# Patient Record
Sex: Female | Born: 1975
Health system: Southern US, Community
[De-identification: ages and names within clinical notes are randomized; demographics above are authoritative.]

## PROBLEM LIST (undated history)

## (undated) DIAGNOSIS — T7840XA Allergy, unspecified, initial encounter: Secondary | ICD-10-CM

## (undated) DIAGNOSIS — E119 Type 2 diabetes mellitus without complications: Secondary | ICD-10-CM

## (undated) HISTORY — DX: Allergy, unspecified, initial encounter: T78.40XA

## (undated) HISTORY — PX: WISDOM TOOTH EXTRACTION: SHX21

---

## 1998-02-20 HISTORY — PX: UMBILICAL HERNIA REPAIR: SHX196

## 1998-02-20 HISTORY — PX: CHOLECYSTECTOMY: SHX55

## 1998-06-03 ENCOUNTER — Other Ambulatory Visit: Admission: RE | Admit: 1998-06-03 | Discharge: 1998-06-03 | Payer: Self-pay | Admitting: Obstetrics and Gynecology

## 1998-11-29 ENCOUNTER — Emergency Department (HOSPITAL_COMMUNITY): Admission: EM | Admit: 1998-11-29 | Discharge: 1998-11-29 | Payer: Self-pay | Admitting: Emergency Medicine

## 1999-07-22 ENCOUNTER — Other Ambulatory Visit: Admission: RE | Admit: 1999-07-22 | Discharge: 1999-07-22 | Payer: Self-pay | Admitting: Obstetrics and Gynecology

## 2000-01-06 ENCOUNTER — Encounter: Admission: RE | Admit: 2000-01-06 | Discharge: 2000-01-06 | Payer: Self-pay | Admitting: Gastroenterology

## 2000-01-06 ENCOUNTER — Encounter: Payer: Self-pay | Admitting: Gastroenterology

## 2000-01-31 ENCOUNTER — Encounter (HOSPITAL_BASED_OUTPATIENT_CLINIC_OR_DEPARTMENT_OTHER): Payer: Self-pay | Admitting: General Surgery

## 2000-02-01 ENCOUNTER — Encounter (INDEPENDENT_AMBULATORY_CARE_PROVIDER_SITE_OTHER): Payer: Self-pay | Admitting: Specialist

## 2000-02-01 ENCOUNTER — Ambulatory Visit (HOSPITAL_COMMUNITY): Admission: RE | Admit: 2000-02-01 | Discharge: 2000-02-02 | Payer: Self-pay | Admitting: General Surgery

## 2000-02-01 ENCOUNTER — Encounter (HOSPITAL_BASED_OUTPATIENT_CLINIC_OR_DEPARTMENT_OTHER): Payer: Self-pay | Admitting: General Surgery

## 2000-08-29 ENCOUNTER — Other Ambulatory Visit: Admission: RE | Admit: 2000-08-29 | Discharge: 2000-08-29 | Payer: Self-pay | Admitting: Obstetrics and Gynecology

## 2004-02-10 ENCOUNTER — Other Ambulatory Visit: Admission: RE | Admit: 2004-02-10 | Discharge: 2004-02-10 | Payer: Self-pay | Admitting: Obstetrics and Gynecology

## 2005-11-03 ENCOUNTER — Emergency Department (HOSPITAL_COMMUNITY): Admission: EM | Admit: 2005-11-03 | Discharge: 2005-11-03 | Payer: Self-pay | Admitting: Emergency Medicine

## 2009-04-30 ENCOUNTER — Observation Stay (HOSPITAL_COMMUNITY): Admission: EM | Admit: 2009-04-30 | Discharge: 2009-04-30 | Payer: Self-pay | Admitting: Emergency Medicine

## 2010-05-13 LAB — DIFFERENTIAL
Basophils Relative: 0 % (ref 0–1)
Eosinophils Absolute: 0 10*3/uL (ref 0.0–0.7)
Eosinophils Relative: 0 % (ref 0–5)
Lymphs Abs: 1.1 10*3/uL (ref 0.7–4.0)
Monocytes Relative: 1 % — ABNORMAL LOW (ref 3–12)

## 2010-05-13 LAB — URINE MICROSCOPIC-ADD ON

## 2010-05-13 LAB — URINALYSIS, ROUTINE W REFLEX MICROSCOPIC
Bilirubin Urine: NEGATIVE
Glucose, UA: >1000 mg/dL — AB
Ketones, ur: 15 mg/dL — AB
Leukocytes, UA: NEGATIVE
Nitrite: NEGATIVE
Protein, ur: 30 mg/dL — AB
Specific Gravity, Urine: 1.036 — ABNORMAL HIGH (ref 1.005–1.030)
Urobilinogen, UA: 1 mg/dL (ref 0.0–1.0)
pH: 5.5 (ref 5.0–8.0)

## 2010-05-13 LAB — COMPREHENSIVE METABOLIC PANEL
ALT: 14 U/L (ref 0–35)
AST: 18 U/L (ref 0–37)
Alkaline Phosphatase: 63 U/L (ref 39–117)
CO2: 24 mEq/L (ref 19–32)
Calcium: 8.9 mg/dL (ref 8.4–10.5)
GFR calc Af Amer: >60 mL/min (ref >60)
GFR calc non Af Amer: >60 mL/min (ref >60)
Glucose, Bld: 267 mg/dL — ABNORMAL HIGH (ref 70–99)
Potassium: 3.7 mEq/L (ref 3.5–5.1)
Sodium: 132 mEq/L — ABNORMAL LOW (ref 135–145)

## 2010-05-13 LAB — CBC
Hemoglobin: 12.9 g/dL (ref 12.0–15.0)
MCHC: 34.1 g/dL (ref 30.0–36.0)
RBC: 4.67 MIL/uL (ref 3.87–5.11)
WBC: 14.1 10*3/uL — ABNORMAL HIGH (ref 4.0–10.5)

## 2010-05-13 LAB — LIPASE, BLOOD: Lipase: 18 U/L (ref 11–59)

## 2010-06-01 ENCOUNTER — Emergency Department (HOSPITAL_COMMUNITY)
Admission: EM | Admit: 2010-06-01 | Discharge: 2010-06-01 | Disposition: A | Discharge status: Home / Self Care | Payer: BC Managed Care – HMO | Attending: Emergency Medicine | Admitting: Emergency Medicine

## 2010-06-01 DIAGNOSIS — E1169 Type 2 diabetes mellitus with other specified complication: Secondary | ICD-10-CM | POA: Insufficient documentation

## 2010-06-01 DIAGNOSIS — R509 Fever, unspecified: Secondary | ICD-10-CM | POA: Insufficient documentation

## 2010-06-01 DIAGNOSIS — R112 Nausea with vomiting, unspecified: Secondary | ICD-10-CM | POA: Insufficient documentation

## 2010-06-01 LAB — POCT PREGNANCY, URINE: Preg Test, Ur: NEGATIVE

## 2010-06-01 LAB — URINALYSIS, ROUTINE W REFLEX MICROSCOPIC
Nitrite: NEGATIVE
Specific Gravity, Urine: 1.023 (ref 1.005–1.030)
Urobilinogen, UA: 2 mg/dL — ABNORMAL HIGH (ref 0.0–1.0)
pH: 6 (ref 5.0–8.0)

## 2010-06-01 LAB — URINE MICROSCOPIC-ADD ON

## 2010-06-01 LAB — POCT I-STAT, CHEM 8
Calcium, Ion: 1.08 mmol/L — ABNORMAL LOW (ref 1.12–1.32)
HCT: 39 % (ref 36.0–46.0)
Hemoglobin: 13.3 g/dL (ref 12.0–15.0)
TCO2: 23 mmol/L (ref 0–100)

## 2010-06-01 LAB — GLUCOSE, CAPILLARY
Glucose-Capillary: 247 mg/dL — ABNORMAL HIGH (ref 70–99)
Glucose-Capillary: 218 mg/dL — ABNORMAL HIGH (ref 70–99)

## 2012-06-03 ENCOUNTER — Encounter (HOSPITAL_COMMUNITY): Payer: Self-pay | Admitting: Emergency Medicine

## 2012-06-03 ENCOUNTER — Emergency Department (HOSPITAL_COMMUNITY)
Admission: EM | Admit: 2012-06-03 | Discharge: 2012-06-03 | Disposition: A | Discharge status: Home / Self Care | Payer: BC Managed Care – HMO | Attending: Emergency Medicine | Admitting: Emergency Medicine

## 2012-06-03 DIAGNOSIS — E119 Type 2 diabetes mellitus without complications: Secondary | ICD-10-CM | POA: Insufficient documentation

## 2012-06-03 DIAGNOSIS — L02415 Cutaneous abscess of right lower limb: Secondary | ICD-10-CM

## 2012-06-03 DIAGNOSIS — L02419 Cutaneous abscess of limb, unspecified: Secondary | ICD-10-CM | POA: Insufficient documentation

## 2012-06-03 HISTORY — DX: Type 2 diabetes mellitus without complications: E11.9

## 2012-06-03 MED ORDER — CIPROFLOXACIN HCL 500 MG PO TABS: 500.0000 mg | ORAL_TABLET | Freq: Two times a day (BID) | ORAL | Status: DC

## 2012-06-03 MED ORDER — CIPROFLOXACIN HCL 500 MG PO TABS
500.0000 mg | ORAL_TABLET | Freq: Once | ORAL | Status: AC
  Administered 2012-06-03: 500 mg via ORAL
  Filled 2012-06-03: qty 1

## 2012-06-03 MED ORDER — ACETAMINOPHEN 500 MG PO TABS: 500.0000 mg | ORAL_TABLET | Freq: Four times a day (QID) | ORAL | Status: DC | PRN

## 2012-06-03 NOTE — ED Notes (Signed)
Open wound approx the size of a dime with purulent drainage surrounded by a large circle of redness. Pt noticed it 2 days ago. Started as a much smaller wound and has gotten larger and redder. Wound is very tender.

## 2012-06-03 NOTE — ED Provider Notes (Signed)
History     CSN: [id number]  Arrival date & time 06/03/12  1140   First MD Initiated Contact with Patient 06/03/12 1323      Chief Complaint  Patient presents with  . Abscess    (Consider location/radiation/quality/duration/timing/severity/associated sxs/prior treatment) Patient is a 37 y.o. female presenting with abscess. The history is provided by the patient. No language interpreter was used.  Abscess Location:  Leg Leg abscess location:  R lower leg Abscess quality: draining, fluctuance, painful, redness, warmth and weeping   Abscess quality: no induration   Red streaking: no   Duration:  2 days Progression:  Worsening Pain details:    Quality:  Aching, throbbing, pressure and sharp   Severity:  Moderate   Duration:  2 days   Timing:  Constant   Progression:  Waxing and waning Chronicity:  New Context: not diabetes, not immunosuppression, not injected drug use, not insect bite/sting and not skin injury   Associated symptoms: no fever, no nausea and no vomiting   Pt is a 37yo female presenting today with right lower leg abscess. States she 1st noticed a small pus filled lesion on her right lower leg Saturday that has been increasing and size and becoming more painful.  Has been draining purulent discharge.  Pt believed it was a spider bite but never witnessed any insect on her leg.  Has never had this happen before. Pt is diabetic and admits to not checking home sugars regularly.   Past Medical History  Diagnosis Date  . Diabetes mellitus without complication     History reviewed. No pertinent past surgical history.  History reviewed. No pertinent family history.  History  Substance Use Topics  . Smoking status: Never Smoker   . Smokeless tobacco: Not on file  . Alcohol Use: Yes     Comment: occ    OB History   Grav Para Term Preterm Abortions TAB SAB Ect Mult Living                  Review of Systems  Constitutional: Negative for fever and chills.    Respiratory: Negative for shortness of breath and wheezing.   Cardiovascular: Positive for leg swelling. Negative for chest pain.  Gastrointestinal: Negative for nausea, vomiting and diarrhea.  Skin: Positive for color change, rash and wound.  Neurological: Negative for weakness and numbness.    Allergies  Review of patient's allergies indicates no known allergies.  Home Medications   Current Outpatient Rx  Name  Route  Sig  Dispense  Refill  . ibuprofen (ADVIL,MOTRIN) 200 MG tablet   Oral   Take 400 mg by mouth every 8 (eight) hours as needed for pain.         Marland Kitchen acetaminophen (TYLENOL) 500 MG tablet   Oral   Take 1 tablet (500 mg total) by mouth every 6 (six) hours as needed for pain.   30 tablet   0   . ciprofloxacin (CIPRO) 500 MG tablet   Oral   Take 1 tablet (500 mg total) by mouth every 12 (twelve) hours.   20 tablet   0     BP 145/71  Pulse 91  Temp(Src) 98.3 F (36.8 C) (Oral)  Resp 18  SpO2 96%  Physical Exam  Nursing note and vitals reviewed. Constitutional: She appears well-developed and well-nourished. No distress.  HENT:  Head: Normocephalic and atraumatic.  Eyes: Conjunctivae are normal. No scleral icterus.  Neck: Normal range of motion.  Cardiovascular: Normal rate, regular  rhythm and normal heart sounds.   Pulmonary/Chest: Effort normal and breath sounds normal. No respiratory distress. She has no wheezes.  Abdominal: Soft. She exhibits no distension. There is no tenderness.  Musculoskeletal: Normal range of motion. She exhibits edema ( mild edema over right lower leg) and tenderness (  TTP over right lower leg around superficial abscess).  Neurological: She is alert.  Skin: Skin is warm and dry. Rash noted. Rash is macular and pustular ( fluctuant fluid filled 3cm pustular sac on right lower leg encirculed with erythemic rash that expands outward from center  of pustule.). She is not diaphoretic. There is erythema ( Further erythemic magrins  from central abscess are 8cm x 10xm).       ED Course  INCISION AND DRAINAGE Date/Time: 06/03/2012 10:00 PM Performed by: Junius Finner Authorized by: Junius Finner Consent: Verbal consent obtained. written consent not obtained. Risks and benefits: risks, benefits and alternatives were discussed Consent given by: patient Patient understanding: patient states understanding of the procedure being performed Patient consent: the patient's understanding of the procedure matches consent given Procedure consent: procedure consent matches procedure scheduled Site marked: the operative site was marked Patient identity confirmed: verbally with patient Time out: Immediately prior to procedure a "time out" was called to verify the correct patient, procedure, equipment, support staff and site/side marked as required. Type: abscess Body area: lower extremity Location details: right leg Anesthesia: local infiltration Local anesthetic: lidocaine 2% with epinephrine Anesthetic total: 2 ml Patient sedated: no Scalpel size: 11 Needle gauge: 22 Incision type: single straight Complexity: simple Drainage: purulent and serosanguinous Drainage amount: scant Wound treatment: wound left open Patient tolerance: Patient tolerated the procedure well with no immediate complications.   (including critical care time)  Labs Reviewed - No data to display No results found.   1. Abscess of right lower leg       MDM  Pt is 36yo female presenting today after 2 day hx of right lower leg abscess.  Pt believed it was a spider bite but never saw a spider.  Pt has hx of diabetes and admits to not routinely measuring his blood glucose.  Denies previous abscess. Denies fever, chills, n/v/d.   Right lower leg is painful to walk on.  Erythema, pain, and pustule have been worsening since pt first noticed abscess 2 days ago.  Has not tried anything for pain.  Consulted Dr. Silverio Lay. Believes the abscess is a simple,  early superficial abscess, not likely caused by a spider.   Superficial abscess.  I&D with 2% lidocaine with epi  See procedure note.  Tx: cipro, 1st dose in ED.   Rx: Cipro and Norco. May take acetaminophen for pain at work.  Will have pt f/u with PCP or urgent care for further management of abscess.  May return to ED if redness spreads rapidly, develops uncontrolled fever, or abscess appears to be worsening with tx of Cipro.  Vitals: unremarkable. Discharged in stable condition.    Discussed pt with attending during ED encounter.    Junius Finner, PA-C 06/03/12 2214

## 2012-06-03 NOTE — ED Notes (Signed)
Pt c/o abscess to right leg with purulent discharge; pt thinks could be from spider bite; redness noted around area

## 2012-06-05 ENCOUNTER — Telehealth (HOSPITAL_COMMUNITY): Payer: Self-pay | Admitting: Emergency Medicine

## 2012-06-05 NOTE — ED Provider Notes (Signed)
Medical screening examination/treatment/procedure(s) were conducted as a shared visit with non-physician practitioner(s) and myself.  I personally evaluated the patient during the encounter  Belinda Reilly is a 37 y.o. female here with possible spider bite. No recent travel to suggest lyme disease. Rash is erythematous with a yellow fluctuance in the center. She likely has cellulitis with superficial abscess. Abscess drained by PA. She is d/c home on cipro but I think she should be d/c on clinda.   3:35 PM I asked flow manager to call patient. If feeling better then can continue cipro. If not better, should change to clinda 300 mg four times a day for a week.    Richardean Canal, MD 06/05/12 1536

## 2012-12-16 ENCOUNTER — Telehealth: Payer: Self-pay

## 2012-12-16 NOTE — Telephone Encounter (Addendum)
Left message for call back Non identifiable  New patient Medication and allergies: reviewed (patient is not taking any medications and no allergies)  90 day supply/mail order: na Local pharmacy: na   Immunizations due:  Declines flu, tdap UTD HM updated  A/P:   FH entered---no surgical hx Unsure of any dates for PAP's etc.  Has not taken care of herself but she is ready to   To Discuss with Provider: Allergies--has a nasal spray in the past that helped/unsure which one Female Care with PCP--needs pap and has vaginal discharge Dx with DM but has not taken medications in years Advised to fast Advised that provider would cover as many issues as possible this visit

## 2012-12-18 ENCOUNTER — Other Ambulatory Visit: Payer: Self-pay | Admitting: General Practice

## 2012-12-18 ENCOUNTER — Encounter: Payer: Self-pay | Admitting: Family Medicine

## 2012-12-18 ENCOUNTER — Encounter: Payer: Self-pay | Admitting: General Practice

## 2012-12-18 ENCOUNTER — Other Ambulatory Visit (HOSPITAL_COMMUNITY)
Admission: RE | Admit: 2012-12-18 | Discharge: 2012-12-18 | Disposition: A | Discharge status: Home / Self Care | Payer: BC Managed Care – PPO | Source: Ambulatory Visit | Attending: Family Medicine | Admitting: Family Medicine

## 2012-12-18 ENCOUNTER — Ambulatory Visit (INDEPENDENT_AMBULATORY_CARE_PROVIDER_SITE_OTHER): Payer: BC Managed Care – PPO | Admitting: Family Medicine

## 2012-12-18 VITALS — BP 130/82 | HR 81 | Temp 98.3°F | Resp 16 | Ht 66.5 in | Wt 258.0 lb

## 2012-12-18 DIAGNOSIS — Z803 Family history of malignant neoplasm of breast: Secondary | ICD-10-CM

## 2012-12-18 DIAGNOSIS — N898 Other specified noninflammatory disorders of vagina: Secondary | ICD-10-CM

## 2012-12-18 DIAGNOSIS — Z0000 Encounter for general adult medical examination without abnormal findings: Secondary | ICD-10-CM

## 2012-12-18 DIAGNOSIS — Z01419 Encounter for gynecological examination (general) (routine) without abnormal findings: Secondary | ICD-10-CM

## 2012-12-18 DIAGNOSIS — Z1151 Encounter for screening for human papillomavirus (HPV): Secondary | ICD-10-CM | POA: Insufficient documentation

## 2012-12-18 DIAGNOSIS — Z124 Encounter for screening for malignant neoplasm of cervix: Secondary | ICD-10-CM | POA: Insufficient documentation

## 2012-12-18 LAB — CBC WITH DIFFERENTIAL/PLATELET
Basophils Absolute: 0 10*3/uL (ref 0.0–0.1)
Eosinophils Absolute: 0.1 10*3/uL (ref 0.0–0.7)
Lymphocytes Relative: 28 % (ref 12.0–46.0)
MCHC: 33.9 g/dL (ref 30.0–36.0)
Neutro Abs: 6.1 10*3/uL (ref 1.4–7.7)
Neutrophils Relative %: 65.4 % (ref 43.0–77.0)
Platelets: 304 10*3/uL (ref 150.0–400.0)
RDW: 15.2 % — ABNORMAL HIGH (ref 11.5–14.6)

## 2012-12-18 LAB — LIPID PANEL
Cholesterol: 193 mg/dL (ref 0–200)
Total CHOL/HDL Ratio: 5
Triglycerides: 205 mg/dL — ABNORMAL HIGH (ref 0.0–149.0)
VLDL: 41 mg/dL — ABNORMAL HIGH (ref 0.0–40.0)

## 2012-12-18 LAB — BASIC METABOLIC PANEL
Chloride: 98 mEq/L (ref 96–112)
Creatinine, Ser: 0.6 mg/dL (ref 0.4–1.2)
Potassium: 3.5 mEq/L (ref 3.5–5.1)

## 2012-12-18 LAB — HEPATIC FUNCTION PANEL
ALT: 14 U/L (ref 0–35)
Bilirubin, Direct: 0.2 mg/dL (ref 0.0–0.3)
Total Bilirubin: 1.3 mg/dL — ABNORMAL HIGH (ref 0.3–1.2)

## 2012-12-18 LAB — LDL CHOLESTEROL, DIRECT: Direct LDL: 125.4 mg/dL

## 2012-12-18 MED ORDER — METFORMIN HCL 1000 MG PO TABS: 500.0000 mg | ORAL_TABLET | Freq: Two times a day (BID) | ORAL | Status: DC

## 2012-12-18 NOTE — Assessment & Plan Note (Signed)
New.  Mom was dx'd at age 37.  Refer for mammo.

## 2012-12-18 NOTE — Assessment & Plan Note (Signed)
New.  Wet prep collected

## 2012-12-18 NOTE — Progress Notes (Signed)
  Subjective:    Patient ID: Belinda Reilly, female    DOB: Oct 23, 1975, 37 y.o.   MRN: [id number]  HPI New to establish.  No previous PCP.  Overdue on pap.  Needs mammo due to family hx of breast cancer.  + white vaginal d/c x2 yrs.  No itching.  Thick.  + odor.   Review of Systems Patient reports no vision/ hearing changes, adenopathy,fever, weight change,  persistant/recurrent hoarseness , swallowing issues, chest pain, palpitations, edema, persistant/recurrent cough, hemoptysis, dyspnea (rest/exertional/paroxysmal nocturnal), gastrointestinal bleeding (melena, rectal bleeding), abdominal pain, significant heartburn, bowel changes, GU symptoms (dysuria, hematuria, incontinence),  syncope, focal weakness, memory loss, numbness & tingling, skin/hair/nail changes, abnormal bruising or bleeding, anxiety, or depression.     Objective:   Physical Exam  General Appearance:    Alert, cooperative, no distress, appears stated age  Head:    Normocephalic, without obvious abnormality, atraumatic  Eyes:    PERRL, conjunctiva/corneas clear, EOM's intact, fundi    benign, both eyes  Ears:    Normal TM's and external ear canals, both ears  Nose:   Nares normal, septum midline, mucosa normal, no drainage    or sinus tenderness  Throat:   Lips, mucosa, and tongue normal; teeth and gums normal  Neck:   Supple, symmetrical, trachea midline, no adenopathy;    Thyroid: no enlargement/tenderness/nodules  Back:     Symmetric, no curvature, ROM normal, no CVA tenderness  Lungs:     Clear to auscultation bilaterally, respirations unlabored  Chest Wall:    No tenderness or deformity   Heart:    Regular rate and rhythm, S1 and S2 normal, no murmur, rub   or gallop  Breast Exam:    No tenderness, masses, or nipple abnormality  Abdomen:     Soft, non-tender, bowel sounds active all four quadrants,    no masses, no organomegaly  Genitalia:    External genitalia normal, cervix friable, no CMT, uterus in normal size  and position, adnexa w/out mass or tenderness, mucosa pink and moist, no lesions, thick, malodorous d/c present  Rectal:    Normal external appearance  Extremities:   Extremities normal, atraumatic, no cyanosis or edema  Pulses:   2+ and symmetric all extremities  Skin:   Skin color, texture, turgor normal, no rashes or lesions  Lymph nodes:   Cervical, supraclavicular, and axillary nodes normal  Neurologic:   CNII-XII intact, normal strength, sensation and reflexes    throughout          Assessment & Plan:

## 2012-12-18 NOTE — Assessment & Plan Note (Signed)
New to provider, ongoing for pt.  Check labs to risk stratify.  Encouraged healthy diet and regular exercise.  Will follow.

## 2012-12-18 NOTE — Assessment & Plan Note (Signed)
Pt's PE WNL w/ exception of obesity and vaginal d/c.  Check labs.  Anticipatory guidance provided.

## 2012-12-18 NOTE — Patient Instructions (Signed)
Follow up in 3-4 months to recheck sugars We'll notify you of your lab results and make any changes if needed We'll call you with your mammogram appt Try and make healthy food choices and get regular exercise Call with any questions or concerns Welcome!  We're glad to have you!

## 2012-12-18 NOTE — Assessment & Plan Note (Signed)
Pap collected. 

## 2012-12-19 ENCOUNTER — Other Ambulatory Visit: Payer: Self-pay | Admitting: General Practice

## 2012-12-19 LAB — WET PREP BY MOLECULAR PROBE
Candida species: NEGATIVE
Gardnerella vaginalis: POSITIVE — AB
Trichomonas vaginosis: POSITIVE — AB

## 2012-12-19 MED ORDER — METRONIDAZOLE 500 MG PO TABS: 500.0000 mg | ORAL_TABLET | Freq: Two times a day (BID) | ORAL | Status: DC

## 2012-12-23 ENCOUNTER — Encounter: Payer: Self-pay | Admitting: General Practice

## 2012-12-23 LAB — VITAMIN D 1,25 DIHYDROXY: Vitamin D2 1, 25 (OH)2: <8 pg/mL

## 2013-01-03 ENCOUNTER — Other Ambulatory Visit: Payer: Self-pay | Admitting: Family Medicine

## 2013-01-06 NOTE — Telephone Encounter (Signed)
Pt last seen 10/29. Please advise if ok for refill.

## 2013-01-15 ENCOUNTER — Ambulatory Visit
Admission: RE | Admit: 2013-01-15 | Discharge: 2013-01-15 | Disposition: A | Discharge status: Home / Self Care | Payer: BC Managed Care – PPO | Source: Ambulatory Visit | Attending: Family Medicine | Admitting: Family Medicine

## 2013-01-15 DIAGNOSIS — Z803 Family history of malignant neoplasm of breast: Secondary | ICD-10-CM

## 2013-01-20 ENCOUNTER — Other Ambulatory Visit: Payer: Self-pay | Admitting: Family Medicine

## 2013-01-20 DIAGNOSIS — R928 Other abnormal and inconclusive findings on diagnostic imaging of breast: Secondary | ICD-10-CM

## 2013-02-04 ENCOUNTER — Ambulatory Visit
Admission: RE | Admit: 2013-02-04 | Discharge: 2013-02-04 | Disposition: A | Discharge status: Home / Self Care | Payer: BC Managed Care – PPO | Source: Ambulatory Visit | Attending: Family Medicine | Admitting: Family Medicine

## 2013-02-04 DIAGNOSIS — R928 Other abnormal and inconclusive findings on diagnostic imaging of breast: Secondary | ICD-10-CM

## 2013-03-26 ENCOUNTER — Ambulatory Visit (INDEPENDENT_AMBULATORY_CARE_PROVIDER_SITE_OTHER): Payer: BC Managed Care – PPO | Admitting: Family Medicine

## 2013-03-26 ENCOUNTER — Encounter: Payer: Self-pay | Admitting: Family Medicine

## 2013-03-26 VITALS — BP 122/82 | HR 78 | Temp 98.3°F | Resp 16 | Wt 259.1 lb

## 2013-03-26 DIAGNOSIS — IMO0002 Reserved for concepts with insufficient information to code with codable children: Secondary | ICD-10-CM

## 2013-03-26 DIAGNOSIS — IMO0001 Reserved for inherently not codable concepts without codable children: Secondary | ICD-10-CM

## 2013-03-26 DIAGNOSIS — E1165 Type 2 diabetes mellitus with hyperglycemia: Secondary | ICD-10-CM

## 2013-03-26 DIAGNOSIS — E119 Type 2 diabetes mellitus without complications: Secondary | ICD-10-CM | POA: Insufficient documentation

## 2013-03-26 LAB — BASIC METABOLIC PANEL
BUN: 10 mg/dL (ref 6–23)
CHLORIDE: 100 meq/L (ref 96–112)
CO2: 25 meq/L (ref 19–32)
Calcium: 8.5 mg/dL (ref 8.4–10.5)
Creatinine, Ser: 0.7 mg/dL (ref 0.4–1.2)
GFR: 128.45 mL/min (ref >60.00)
Glucose, Bld: 267 mg/dL — ABNORMAL HIGH (ref 70–99)
POTASSIUM: 3.7 meq/L (ref 3.5–5.1)
Sodium: 132 mEq/L — ABNORMAL LOW (ref 135–145)

## 2013-03-26 LAB — HEMOGLOBIN A1C: Hgb A1c MFr Bld: 10.1 % — ABNORMAL HIGH (ref 4.6–6.5)

## 2013-03-26 NOTE — Progress Notes (Signed)
   Subjective:    Patient ID: Belinda Reilly, female    DOB: 11/24/1975, 38 y.o.   MRN: [id number]  HPI DM- newly dx'd at last visit.  Started on Metformin but 'i'm horrible w/ it'.  Not taking regularly.  When she does take it, it causes diarrhea.  Only taking meds twice weekly.  Has started walking.  Pt has started to cut back on carb intake and portion size.  Interested in meeting w/ diabetes nutritionist.  Pt reports the hang up w/ metformin is the size of the pill.  No CP, SOB, HAs, visual changes, edema, N/V.   Review of Systems For ROS see HPI     Objective:   Physical Exam  Vitals reviewed. Constitutional: She is oriented to person, place, and time. She appears well-developed and well-nourished. No distress.  HENT:  Head: Normocephalic and atraumatic.  Eyes: Conjunctivae and EOM are normal. Pupils are equal, round, and reactive to light.  Neck: Normal range of motion. Neck supple. No thyromegaly present.  Cardiovascular: Normal rate, regular rhythm, normal heart sounds and intact distal pulses.   No murmur heard. Pulmonary/Chest: Effort normal and breath sounds normal. No respiratory distress.  Abdominal: Soft. She exhibits no distension. There is no tenderness.  Musculoskeletal: She exhibits no edema.  Lymphadenopathy:    She has no cervical adenopathy.  Neurological: She is alert and oriented to person, place, and time.  Skin: Skin is warm and dry.  Psychiatric: She has a normal mood and affect. Her behavior is normal.          Assessment & Plan:

## 2013-03-26 NOTE — Patient Instructions (Signed)
Follow up in 3-4 months to recheck sugar Start to crush the Metformin twice daily- put in apple sauce, sugar free pudding, etc We'll notify you of your lab results and make any changes We'll call you with your nutrition appt Call with any questions or concerns You can do this!  Healthy diet, regular exercise and you'll start to see the changes!

## 2013-03-26 NOTE — Assessment & Plan Note (Signed)
Pt has not been taking meds regularly.  Has not lost weight since last visit.  Has just started exercising and attempting to limit carbs in diet.  Will refer to nutrition.  Stressed need to take meds regularly to avoid complications of high sugars.  Check labs.  May need Endo referral if A1C remains elevated.  Will follow.

## 2013-03-26 NOTE — Progress Notes (Signed)
Pre visit review using our clinic review tool, if applicable. No additional management support is needed unless otherwise documented below in the visit note. 

## 2013-03-28 ENCOUNTER — Telehealth: Payer: Self-pay

## 2013-03-28 ENCOUNTER — Encounter: Payer: Self-pay | Admitting: General Practice

## 2013-03-28 NOTE — Telephone Encounter (Signed)
Relevant patient education assigned to patient using Emmi. ° °

## 2013-05-08 ENCOUNTER — Encounter: Payer: BC Managed Care – PPO | Attending: Family Medicine

## 2013-05-08 VITALS — Ht 65.5 in | Wt 258.1 lb

## 2013-05-08 DIAGNOSIS — E1165 Type 2 diabetes mellitus with hyperglycemia: Secondary | ICD-10-CM

## 2013-05-08 DIAGNOSIS — E119 Type 2 diabetes mellitus without complications: Secondary | ICD-10-CM | POA: Insufficient documentation

## 2013-05-08 DIAGNOSIS — IMO0002 Reserved for concepts with insufficient information to code with codable children: Secondary | ICD-10-CM

## 2013-05-08 DIAGNOSIS — Z713 Dietary counseling and surveillance: Secondary | ICD-10-CM | POA: Insufficient documentation

## 2013-05-08 NOTE — Progress Notes (Signed)
Patient was seen on 05/08/13 for the first of a series of three diabetes self-management courses at [facility].  Current HbA1c: 10.0%  The following learning objectives were met by the patient during this class:  Describe diabetes  State some common risk factors for diabetes  Defines the role of glucose and insulin  Identifies type of diabetes and pathophysiology  Describe the relationship between diabetes and cardiovascular risk  State the members of the Healthcare Team  States the rationale for glucose monitoring  State when to test glucose  State their individual Target Range  State the importance of logging glucose readings  Describe how to interpret glucose readings  Identifies A1C target  Explain the correlation between A1c and eAG values  State symptoms and treatment of high blood glucose  State symptoms and treatment of low blood glucose  Explain proper technique for glucose testing  Identifies proper sharps disposal  Handouts given during class include:  Living Well with Diabetes book  Carb Counting and Meal Planning book  Meal Plan Card  Carbohydrate guide  Meal planning worksheet  Low Sodium Flavoring Tips  The diabetes portion plate  I2M to eAG Conversion Chart  Diabetes Medications  Diabetes Recommended Care Schedule  Support Group  Diabetes Success Plan  Core Class Satisfaction Survey  Follow-Up Plan:  Attend core 2

## 2013-05-12 ENCOUNTER — Telehealth: Payer: Self-pay

## 2013-05-12 MED ORDER — ONETOUCH ULTRA MINI W/DEVICE KIT: PACK | Status: DC

## 2013-05-12 MED ORDER — ONETOUCH DELICA LANCETS 33G MISC
Status: DC
Start: 2013-05-12 — End: 2014-03-05

## 2013-05-12 MED ORDER — GLUCOSE BLOOD VI STRP: ORAL_STRIP | Status: DC

## 2013-05-12 NOTE — Telephone Encounter (Signed)
The patient is hoping to get a meter and test strips so she can do her own blood glucose testing - she is hoping to get this called into a pharmacy - [facility] on [address].   Callback - [phone]

## 2013-05-12 NOTE — Telephone Encounter (Signed)
Called pt and asked to verify what glucometers are covered under their formulary.

## 2013-05-12 NOTE — Telephone Encounter (Signed)
Pt.notified

## 2013-05-12 NOTE — Telephone Encounter (Signed)
Pt called back and stated according to her insurance and to [facility], any meter brand the doctor chooses to order will be covered 100%.   Thanks!

## 2013-05-15 DIAGNOSIS — E1165 Type 2 diabetes mellitus with hyperglycemia: Secondary | ICD-10-CM

## 2013-05-15 DIAGNOSIS — IMO0002 Reserved for concepts with insufficient information to code with codable children: Secondary | ICD-10-CM

## 2013-05-15 NOTE — Progress Notes (Signed)
Patient was seen on 05/15/13 for the second of a series of three diabetes self-management courses at the [facility]. The following learning objectives were met by the patient during this class:   Describe the role of different macronutrients on glucose  Explain how carbohydrates affect blood glucose  State what foods contain the most carbohydrates  Demonstrate carbohydrate counting  Demonstrate how to read Nutrition Facts food label  Describe effects of various fats on heart health  Describe the importance of good nutrition for health and healthy eating strategies  Describe techniques for managing your shopping, cooking and meal planning  List strategies to follow meal plan when dining out  Describe the effects of alcohol on glucose and how to use it safely  Goals:  Follow Diabetes Meal Plan as instructed  Eat 3 meals and 2 snacks, every 3-5 hrs  Aim for carbohydrate intake to 45 grams carbohydrate/meal Aim for carbohydrate intake to 15 grams carbohydrate/snack Add lean protein foods to meals/snacks  Monitor glucose levels as instructed by your doctor   Follow-Up Plan:  Attend Core 3  Work towards following your personal food plan.  

## 2013-05-22 ENCOUNTER — Ambulatory Visit: Payer: BC Managed Care – PPO

## 2013-05-27 ENCOUNTER — Encounter: Payer: BC Managed Care – PPO | Attending: Family Medicine

## 2013-05-27 DIAGNOSIS — IMO0002 Reserved for concepts with insufficient information to code with codable children: Secondary | ICD-10-CM

## 2013-05-27 DIAGNOSIS — Z713 Dietary counseling and surveillance: Secondary | ICD-10-CM | POA: Insufficient documentation

## 2013-05-27 DIAGNOSIS — E1165 Type 2 diabetes mellitus with hyperglycemia: Secondary | ICD-10-CM

## 2013-05-27 DIAGNOSIS — E119 Type 2 diabetes mellitus without complications: Secondary | ICD-10-CM | POA: Insufficient documentation

## 2013-05-27 NOTE — Progress Notes (Signed)
Patient was seen on 05/27/13 for the third of a series of three diabetes self-management courses at the [facility]. The following learning objectives were met by the patient during this class:    State the amount of activity recommended for healthy living   Describe activities suitable for individual needs   Identify ways to regularly incorporate activity into daily life   Identify barriers to activity and ways to over come these barriers  Identify diabetes medications being personally used and their primary action for lowering glucose and possible side effects   Describe role of stress on blood glucose and develop strategies to address psychosocial issues   Identify diabetes complications and ways to prevent them  Explain how to manage diabetes during illness   Evaluate success in meeting personal goal   Establish 2-3 goals that they will plan to diligently work on until they return for the  64-monthfollow-up visit  Goals:  Follow Diabetes Meal Plan as instructed  Aim for 15-30 mins of physical activity daily as tolerated  Bring food record and glucose log to your follow up visit  Your patient has established the following 4 month goals in their individualized success plan: I will count my carb choices at most meals and snacks I will increase my activity level at least 7 days a week  Your patient has identified these potential barriers to change:  None stated  Your patient has identified their diabetes self-care support plan as  [facility]  My husband shopping with me to help [place] get healthy  Plan:  Attend Core 4 in 4 months

## 2013-06-25 ENCOUNTER — Ambulatory Visit: Payer: BC Managed Care – PPO | Admitting: Family Medicine

## 2013-07-01 ENCOUNTER — Ambulatory Visit (INDEPENDENT_AMBULATORY_CARE_PROVIDER_SITE_OTHER): Payer: BC Managed Care – PPO | Admitting: Family Medicine

## 2013-07-01 ENCOUNTER — Encounter: Payer: Self-pay | Admitting: Family Medicine

## 2013-07-01 VITALS — BP 120/80 | HR 84 | Temp 98.2°F | Resp 16 | Wt 252.2 lb

## 2013-07-01 DIAGNOSIS — E1165 Type 2 diabetes mellitus with hyperglycemia: Secondary | ICD-10-CM

## 2013-07-01 DIAGNOSIS — IMO0002 Reserved for concepts with insufficient information to code with codable children: Secondary | ICD-10-CM

## 2013-07-01 DIAGNOSIS — IMO0001 Reserved for inherently not codable concepts without codable children: Secondary | ICD-10-CM

## 2013-07-01 DIAGNOSIS — J3489 Other specified disorders of nose and nasal sinuses: Secondary | ICD-10-CM

## 2013-07-01 LAB — BASIC METABOLIC PANEL
BUN: 10 mg/dL (ref 6–23)
CHLORIDE: 102 meq/L (ref 96–112)
CO2: 26 meq/L (ref 19–32)
CREATININE: 0.7 mg/dL (ref 0.4–1.2)
Calcium: 8.9 mg/dL (ref 8.4–10.5)
GFR: 117.91 mL/min (ref >60.00)
Glucose, Bld: 213 mg/dL — ABNORMAL HIGH (ref 70–99)
POTASSIUM: 3.9 meq/L (ref 3.5–5.1)
Sodium: 134 mEq/L — ABNORMAL LOW (ref 135–145)

## 2013-07-01 LAB — LIPID PANEL
CHOL/HDL RATIO: 4
CHOLESTEROL: 152 mg/dL (ref 0–200)
HDL: 35.3 mg/dL — AB (ref >39.00)
LDL CALC: 93 mg/dL (ref 0–99)
TRIGLYCERIDES: 117 mg/dL (ref 0.0–149.0)
VLDL: 23.4 mg/dL (ref 0.0–40.0)

## 2013-07-01 LAB — HEPATIC FUNCTION PANEL
ALBUMIN: 3.7 g/dL (ref 3.5–5.2)
ALT: 16 U/L (ref 0–35)
AST: 16 U/L (ref 0–37)
Alkaline Phosphatase: 54 U/L (ref 39–117)
Bilirubin, Direct: 0.2 mg/dL (ref 0.0–0.3)
TOTAL PROTEIN: 7.5 g/dL (ref 6.0–8.3)
Total Bilirubin: 1.2 mg/dL (ref 0.2–1.2)

## 2013-07-01 LAB — HEMOGLOBIN A1C: Hgb A1c MFr Bld: 7.4 % — ABNORMAL HIGH (ref 4.6–6.5)

## 2013-07-01 LAB — MICROALBUMIN / CREATININE URINE RATIO
Creatinine,U: 68.5 mg/dL
Microalb Creat Ratio: 0.3 mg/g (ref 0.0–30.0)
Microalb, Ur: 0.2 mg/dL (ref 0.0–1.9)

## 2013-07-01 NOTE — Progress Notes (Signed)
   Subjective:    Patient ID: Belinda Reilly [place], female    DOB: 07/23/1975, 38 y.o.   MRN: [id number]  HPI DM- since last visit, pt has lost 6 lbs and is seeing nutrition.  Last A1C 10.1.  Pt reports feeling well.  Taking Metformin regularly BID.  Pt reports thirst and urination have improved.  No CP, SOB, HAs, blurry/double vision- due for eye exam (pt plans to call and schedule appt).  No numbness/tingling of hands/feet.  Still having intermittent diarrhea but improved from previously.  Perforated nasal septum- pt reports she'Reilly known this for 'my whole life' and never thought anything about it until husband brought it to her attention.  Review of Systems For ROS see HPI     Objective:   Physical Exam  Vitals reviewed. Constitutional: She is oriented to person, place, and time. She appears well-developed and well-nourished. No distress.  HENT:  Head: Normocephalic and atraumatic.  Perforated nasal septum  Eyes: Conjunctivae and EOM are normal. Pupils are equal, round, and reactive to light.  Neck: Normal range of motion. Neck supple. No thyromegaly present.  Cardiovascular: Normal rate, regular rhythm, normal heart sounds and intact distal pulses.   No murmur heard. Pulmonary/Chest: Effort normal and breath sounds normal. No respiratory distress.  Abdominal: Soft. She exhibits no distension. There is no tenderness.  Musculoskeletal: She exhibits no edema.  Lymphadenopathy:    She has no cervical adenopathy.  Neurological: She is alert and oriented to person, place, and time.  Skin: Skin is warm and dry.  Psychiatric: She has a normal mood and affect. Her behavior is normal.          Assessment & Plan:

## 2013-07-01 NOTE — Progress Notes (Signed)
Pre visit review using our clinic review tool, if applicable. No additional management support is needed unless otherwise documented below in the visit note. 

## 2013-07-01 NOTE — Patient Instructions (Signed)
Follow up in 3-4 months to recheck diabetes We'll notify you of your lab results and make any changes if needed Keep up the good work on healthy diet and regular exercise We'll call you with your ENT appt- my guess is that this is just you and not a problem at all! Call with any questions or concerns Happy Early Birthday!!!

## 2013-07-01 NOTE — Assessment & Plan Note (Signed)
New.  Based on pt report this may be congenital.  Refer to ENT for evaluation and determine if additional w/u or tx needed

## 2013-07-01 NOTE — Assessment & Plan Note (Signed)
Pt has lost weight and changed her eating habits since last visit.  Has been to Diabetes Nutrition program and has started taking her Metformin regularly.  Plans to schedule eye exam.  Check labs, including microalbumin, and adjust meds prn.

## 2013-07-02 ENCOUNTER — Encounter: Payer: Self-pay | Admitting: General Practice

## 2013-07-04 ENCOUNTER — Telehealth: Payer: Self-pay | Admitting: *Deleted

## 2013-07-04 NOTE — Telephone Encounter (Signed)
Patient emailed form. Form completed and signed. Called and left message for patient that form is ready for pick up at our front desk. JG//CMA

## 2013-07-08 ENCOUNTER — Other Ambulatory Visit: Payer: Self-pay | Admitting: Family Medicine

## 2013-07-08 DIAGNOSIS — N63 Unspecified lump in unspecified breast: Secondary | ICD-10-CM

## 2013-08-12 ENCOUNTER — Ambulatory Visit
Admission: RE | Admit: 2013-08-12 | Discharge: 2013-08-12 | Disposition: A | Discharge status: Home / Self Care | Payer: BC Managed Care – PPO | Source: Ambulatory Visit | Attending: Family Medicine | Admitting: Family Medicine

## 2013-08-12 DIAGNOSIS — N63 Unspecified lump in unspecified breast: Secondary | ICD-10-CM

## 2013-09-05 ENCOUNTER — Other Ambulatory Visit: Payer: Self-pay | Admitting: Family Medicine

## 2013-09-05 NOTE — Telephone Encounter (Signed)
Med filled.  

## 2013-09-22 ENCOUNTER — Encounter: Payer: BC Managed Care – PPO | Attending: Family Medicine

## 2013-10-02 ENCOUNTER — Emergency Department (HOSPITAL_COMMUNITY): Payer: BC Managed Care – PPO

## 2013-10-02 ENCOUNTER — Encounter (HOSPITAL_COMMUNITY): Payer: Self-pay | Admitting: Emergency Medicine

## 2013-10-02 ENCOUNTER — Emergency Department (HOSPITAL_COMMUNITY)
Admission: EM | Admit: 2013-10-02 | Discharge: 2013-10-02 | Disposition: A | Discharge status: Home / Self Care | Payer: BC Managed Care – PPO | Attending: Emergency Medicine | Admitting: Emergency Medicine

## 2013-10-02 DIAGNOSIS — E119 Type 2 diabetes mellitus without complications: Secondary | ICD-10-CM | POA: Insufficient documentation

## 2013-10-02 DIAGNOSIS — Z79899 Other long term (current) drug therapy: Secondary | ICD-10-CM | POA: Diagnosis not present

## 2013-10-02 DIAGNOSIS — T148XXA Other injury of unspecified body region, initial encounter: Secondary | ICD-10-CM

## 2013-10-02 DIAGNOSIS — Y9389 Activity, other specified: Secondary | ICD-10-CM | POA: Diagnosis not present

## 2013-10-02 DIAGNOSIS — Y92410 Unspecified street and highway as the place of occurrence of the external cause: Secondary | ICD-10-CM | POA: Diagnosis not present

## 2013-10-02 DIAGNOSIS — IMO0002 Reserved for concepts with insufficient information to code with codable children: Secondary | ICD-10-CM | POA: Insufficient documentation

## 2013-10-02 DIAGNOSIS — S20229A Contusion of unspecified back wall of thorax, initial encounter: Secondary | ICD-10-CM | POA: Insufficient documentation

## 2013-10-02 MED ORDER — METHOCARBAMOL 500 MG PO TABS
500.0000 mg | ORAL_TABLET | Freq: Once | ORAL | Status: AC
  Administered 2013-10-02: 500 mg via ORAL
  Filled 2013-10-02: qty 1

## 2013-10-02 MED ORDER — IBUPROFEN 600 MG PO TABS: 600.0000 mg | ORAL_TABLET | Freq: Four times a day (QID) | ORAL | Status: DC | PRN

## 2013-10-02 MED ORDER — METHOCARBAMOL 500 MG PO TABS: 500.0000 mg | ORAL_TABLET | Freq: Two times a day (BID) | ORAL | Status: DC

## 2013-10-02 MED ORDER — HYDROCODONE-ACETAMINOPHEN 5-325 MG PO TABS
1.0000 | ORAL_TABLET | Freq: Once | ORAL | Status: AC
Start: 2013-10-02 — End: 2013-10-02
  Administered 2013-10-02: 1 via ORAL
  Filled 2013-10-02: qty 1

## 2013-10-02 MED ORDER — IBUPROFEN 200 MG PO TABS
600.0000 mg | ORAL_TABLET | Freq: Once | ORAL | Status: AC
  Administered 2013-10-02: 600 mg via ORAL
  Filled 2013-10-02: qty 3

## 2013-10-02 MED ORDER — TRAMADOL HCL 50 MG PO TABS: 50.0000 mg | ORAL_TABLET | Freq: Four times a day (QID) | ORAL | Status: DC | PRN
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
## 2013-10-08 ENCOUNTER — Encounter: Payer: Self-pay | Admitting: Family Medicine

## 2013-10-08 ENCOUNTER — Ambulatory Visit (INDEPENDENT_AMBULATORY_CARE_PROVIDER_SITE_OTHER): Payer: BC Managed Care – PPO | Admitting: Family Medicine

## 2013-10-08 VITALS — BP 124/80 | HR 80 | Temp 97.9°F | Resp 16 | Wt 253.0 lb

## 2013-10-08 DIAGNOSIS — IMO0002 Reserved for concepts with insufficient information to code with codable children: Secondary | ICD-10-CM

## 2013-10-08 DIAGNOSIS — IMO0001 Reserved for inherently not codable concepts without codable children: Secondary | ICD-10-CM

## 2013-10-08 DIAGNOSIS — E1165 Type 2 diabetes mellitus with hyperglycemia: Secondary | ICD-10-CM

## 2013-10-08 DIAGNOSIS — M62838 Other muscle spasm: Secondary | ICD-10-CM

## 2013-10-08 LAB — BASIC METABOLIC PANEL
BUN: 12 mg/dL (ref 6–23)
CHLORIDE: 101 meq/L (ref 96–112)
CO2: 27 mEq/L (ref 19–32)
Calcium: 8.8 mg/dL (ref 8.4–10.5)
Creatinine, Ser: 0.7 mg/dL (ref 0.4–1.2)
GFR: 114.03 mL/min (ref >60.00)
Glucose, Bld: 155 mg/dL — ABNORMAL HIGH (ref 70–99)
Potassium: 3.7 mEq/L (ref 3.5–5.1)
Sodium: 133 mEq/L — ABNORMAL LOW (ref 135–145)

## 2013-10-08 LAB — HEMOGLOBIN A1C: HEMOGLOBIN A1C: 6.8 % — AB (ref 4.6–6.5)

## 2013-10-08 MED ORDER — MELOXICAM 15 MG PO TABS: 15.0000 mg | ORAL_TABLET | Freq: Every day | ORAL | Status: DC
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
## 2013-10-08 NOTE — Assessment & Plan Note (Signed)
Chronic problem.  Due for eye exam- pt reports she plans to schedule.  Still having intermittent diarrhea w/ metformin if she misses a dose and restarts.  Check labs.  Adjust meds prn

## 2013-10-08 NOTE — Progress Notes (Signed)
Pre visit review using our clinic review tool, if applicable. No additional management support is needed unless otherwise documented below in the visit note. 
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
## 2013-10-08 NOTE — Patient Instructions (Signed)
Follow up in 3-4 months to recheck diabetes Keep up the good work on healthy diet and regular exercise We'll notify you of your lab results and make any changes if needed STOP the ibuprofen START the mobic daily- take once w/ food Use the Robaxin twice daily for spasm HEAT!!! Call with any questions or concerns Hang in there!!!

## 2013-10-08 NOTE — Assessment & Plan Note (Signed)
New.  Reviewed ER notes.  Pt w/ persistent muscle spasm- see tx plan above.

## 2014-02-03 ENCOUNTER — Telehealth: Payer: Self-pay | Admitting: *Deleted

## 2014-02-03 ENCOUNTER — Ambulatory Visit: Payer: BC Managed Care – PPO | Admitting: Family Medicine

## 2014-02-03 NOTE — Telephone Encounter (Signed)
Pt will need no-show fee and to reschedule

## 2014-02-03 NOTE — Telephone Encounter (Signed)
Pt did not show for appointment 02/03/2014 at 8:30am for "3-4 month fu/wlb--phone not accepting calls at this time"

## 2014-02-03 NOTE — Telephone Encounter (Signed)
See note below

## 2014-02-26 LAB — HM DIABETES EYE EXAM

## 2014-03-05 ENCOUNTER — Ambulatory Visit (INDEPENDENT_AMBULATORY_CARE_PROVIDER_SITE_OTHER): Payer: BLUE CROSS/BLUE SHIELD | Admitting: Family Medicine

## 2014-03-05 ENCOUNTER — Other Ambulatory Visit: Payer: Self-pay | Admitting: General Practice

## 2014-03-05 ENCOUNTER — Encounter: Payer: Self-pay | Admitting: Family Medicine

## 2014-03-05 VITALS — BP 122/76 | HR 79 | Temp 98.5°F | Resp 16 | Ht 65.0 in | Wt 254.0 lb

## 2014-03-05 DIAGNOSIS — IMO0002 Reserved for concepts with insufficient information to code with codable children: Secondary | ICD-10-CM

## 2014-03-05 DIAGNOSIS — Z0000 Encounter for general adult medical examination without abnormal findings: Secondary | ICD-10-CM

## 2014-03-05 DIAGNOSIS — B353 Tinea pedis: Secondary | ICD-10-CM

## 2014-03-05 DIAGNOSIS — E1165 Type 2 diabetes mellitus with hyperglycemia: Secondary | ICD-10-CM

## 2014-03-05 LAB — HEPATIC FUNCTION PANEL
ALT: 12 U/L (ref 0–35)
AST: 15 U/L (ref 0–37)
Albumin: 3.8 g/dL (ref 3.5–5.2)
Alkaline Phosphatase: 55 U/L (ref 39–117)
BILIRUBIN DIRECT: 0.2 mg/dL (ref 0.0–0.3)
TOTAL PROTEIN: 7.5 g/dL (ref 6.0–8.3)
Total Bilirubin: 0.9 mg/dL (ref 0.2–1.2)

## 2014-03-05 LAB — TSH: TSH: 1.14 u[IU]/mL (ref 0.35–4.50)

## 2014-03-05 LAB — BASIC METABOLIC PANEL
BUN: 12 mg/dL (ref 6–23)
CHLORIDE: 103 meq/L (ref 96–112)
CO2: 26 mEq/L (ref 19–32)
Calcium: 8.8 mg/dL (ref 8.4–10.5)
Creatinine, Ser: 0.71 mg/dL (ref 0.40–1.20)
GFR: 117.49 mL/min (ref >60.00)
Glucose, Bld: 187 mg/dL — ABNORMAL HIGH (ref 70–99)
Potassium: 3.8 mEq/L (ref 3.5–5.1)
Sodium: 133 mEq/L — ABNORMAL LOW (ref 135–145)

## 2014-03-05 LAB — CBC WITH DIFFERENTIAL/PLATELET
Basophils Absolute: 0 10*3/uL (ref 0.0–0.1)
Basophils Relative: 0.3 % (ref 0.0–3.0)
Eosinophils Absolute: 0.1 10*3/uL (ref 0.0–0.7)
Eosinophils Relative: 1.4 % (ref 0.0–5.0)
HCT: 34.3 % — ABNORMAL LOW (ref 36.0–46.0)
Hemoglobin: 11.2 g/dL — ABNORMAL LOW (ref 12.0–15.0)
Lymphocytes Relative: 35 % (ref 12.0–46.0)
Lymphs Abs: 2.7 10*3/uL (ref 0.7–4.0)
MCHC: 32.8 g/dL (ref 30.0–36.0)
MCV: 80.8 fl (ref 78.0–100.0)
MONO ABS: 0.4 10*3/uL (ref 0.1–1.0)
Monocytes Relative: 5 % (ref 3.0–12.0)
Neutro Abs: 4.4 10*3/uL (ref 1.4–7.7)
Neutrophils Relative %: 58.3 % (ref 43.0–77.0)
Platelets: 317 10*3/uL (ref 150.0–400.0)
RBC: 4.24 Mil/uL (ref 3.87–5.11)
RDW: 15.2 % (ref 11.5–15.5)
WBC: 7.6 10*3/uL (ref 4.0–10.5)

## 2014-03-05 LAB — MICROALBUMIN / CREATININE URINE RATIO
Creatinine,U: 120 mg/dL
MICROALB UR: 0.5 mg/dL (ref 0.0–1.9)
MICROALB/CREAT RATIO: 0.4 mg/g (ref 0.0–30.0)

## 2014-03-05 LAB — LIPID PANEL
CHOL/HDL RATIO: 4
Cholesterol: 153 mg/dL (ref 0–200)
HDL: 37 mg/dL — AB (ref >39.00)
LDL Cholesterol: 81 mg/dL (ref 0–99)
NONHDL: 116
TRIGLYCERIDES: 173 mg/dL — AB (ref 0.0–149.0)
VLDL: 34.6 mg/dL (ref 0.0–40.0)

## 2014-03-05 LAB — VITAMIN D 25 HYDROXY (VIT D DEFICIENCY, FRACTURES): VITD: 5.75 ng/mL — AB (ref 30.00–100.00)

## 2014-03-05 LAB — HEMOGLOBIN A1C: HEMOGLOBIN A1C: 8.2 % — AB (ref 4.6–6.5)

## 2014-03-05 MED ORDER — VITAMIN D (ERGOCALCIFEROL) 1.25 MG (50000 UNIT) PO CAPS
50000.0000 [IU] | ORAL_CAPSULE | ORAL | Status: DC
Start: 2014-03-05 — End: 2014-08-19

## 2014-03-05 MED ORDER — CLOTRIMAZOLE 1 % EX CREA: 1.0000 "application " | TOPICAL_CREAM | Freq: Two times a day (BID) | CUTANEOUS | Status: DC

## 2014-03-05 MED ORDER — SITAGLIP PHOS-METFORMIN HCL ER 100-1000 MG PO TB24: 1.0000 | ORAL_TABLET | Freq: Every day | ORAL | Status: DC

## 2014-03-05 NOTE — Assessment & Plan Note (Signed)
Pt's PE WNL w/ exception of obesity and foot fungus.  UTD on mammo.  No need for pap at this time (UTD).  Check labs.  Encouraged healthy diet and regular exercise.  Will follow.

## 2014-03-05 NOTE — Assessment & Plan Note (Signed)
Chronic problem.  Pt has not been compliant in follow up.  UTD on eye exam.  Stressed need for healthy diet and regular exercise.  Check labs.  Adjust meds prn

## 2014-03-05 NOTE — Progress Notes (Signed)
Pre visit review using our clinic review tool, if applicable. No additional management support is needed unless otherwise documented below in the visit note. 

## 2014-03-05 NOTE — Assessment & Plan Note (Signed)
Start OTC antifungal cream BID prior to starting oral antifungals.

## 2014-03-05 NOTE — Progress Notes (Signed)
   Subjective:    Patient ID: Belinda Reilly, female    DOB: 06/18/1975, 39 y.o.   MRN: [id number]  HPI CPE- UTD on GYN, eye exam.     Review of Systems Patient reports no vision/ hearing changes, adenopathy,fever, weight change, swallowing issues, chest pain, palpitations, edema, hemoptysis, dyspnea (rest/exertional/paroxysmal nocturnal), gastrointestinal bleeding (melena, rectal bleeding), abdominal pain, significant heartburn, bowel changes, GU symptoms (dysuria, hematuria, incontinence), Gyn symptoms (abnormal  bleeding, pain),  syncope, focal weakness, memory loss, skin/hair/nail changes, abnormal bruising or bleeding, anxiety, or depression.   + chronic PND w/ hoarseness/cough + tingling of legs    Objective:   Physical Exam General Appearance:    Alert, cooperative, no distress, appears stated age  Head:    Normocephalic, without obvious abnormality, atraumatic  Eyes:    PERRL, conjunctiva/corneas clear, EOM's intact, fundi    benign, both eyes  Ears:    Normal TM's and external ear canals, both ears  Nose:   Nares normal, septum midline, mucosa normal, no drainage    or sinus tenderness  Throat:   Lips, mucosa, and tongue normal; teeth and gums normal  Neck:   Supple, symmetrical, trachea midline, no adenopathy;    Thyroid: no enlargement/tenderness/nodules  Back:     Symmetric, no curvature, ROM normal, no CVA tenderness  Lungs:     Clear to auscultation bilaterally, respirations unlabored  Chest Wall:    No tenderness or deformity   Heart:    Regular rate and rhythm, S1 and S2 normal, no murmur, rub   or gallop  Breast Exam:    Deferred to mammo  Abdomen:     Soft, non-tender, bowel sounds active all four quadrants,    no masses, no organomegaly  Genitalia:    Deferred  Rectal:    Extremities:   Extremities normal, atraumatic, no cyanosis or edema  Pulses:   2+ and symmetric all extremities  Skin:   Skin color, texture, turgor normal, no rashes or lesions  Lymph nodes:    Cervical, supraclavicular, and axillary nodes normal  Neurologic:   CNII-XII intact, normal strength, sensation and reflexes    throughout          Assessment & Plan:

## 2014-03-05 NOTE — Patient Instructions (Signed)
Follow up in 3 months to recheck diabetes We'll notify you of your lab results and make any changes if needed Apply the Lotrimin twice daily on your feet (in between your toes) for the fungus Please focus on healthy diet and regular exercise- you can do this! Call and schedule your mammogram at your convenience Use OTC Zyrtec daily and use the generic OTC Flonase- 2 sprays each nostril daily (this is what insurance requires) Call with any questions or concerns Happy New Year!!!

## 2014-04-17 ENCOUNTER — Other Ambulatory Visit: Payer: Self-pay | Admitting: Family Medicine

## 2014-04-17 DIAGNOSIS — N63 Unspecified lump in unspecified breast: Secondary | ICD-10-CM

## 2014-06-02 ENCOUNTER — Ambulatory Visit: Payer: BLUE CROSS/BLUE SHIELD | Admitting: Family Medicine

## 2014-08-19 ENCOUNTER — Encounter: Payer: Self-pay | Admitting: Family Medicine

## 2014-08-19 ENCOUNTER — Ambulatory Visit (INDEPENDENT_AMBULATORY_CARE_PROVIDER_SITE_OTHER): Payer: BLUE CROSS/BLUE SHIELD | Admitting: Family Medicine

## 2014-08-19 VITALS — BP 126/80 | HR 78 | Temp 98.1°F | Resp 16 | Ht 65.0 in | Wt 251.2 lb

## 2014-08-19 DIAGNOSIS — M79622 Pain in left upper arm: Secondary | ICD-10-CM

## 2014-08-19 DIAGNOSIS — E1165 Type 2 diabetes mellitus with hyperglycemia: Secondary | ICD-10-CM

## 2014-08-19 DIAGNOSIS — IMO0002 Reserved for concepts with insufficient information to code with codable children: Secondary | ICD-10-CM

## 2014-08-19 MED ORDER — MELOXICAM 15 MG PO TABS: 15.0000 mg | ORAL_TABLET | Freq: Every day | ORAL | Status: DC

## 2014-08-19 NOTE — Progress Notes (Signed)
Pre visit review using our clinic review tool, if applicable. No additional management support is needed unless otherwise documented below in the visit note. 

## 2014-08-19 NOTE — Assessment & Plan Note (Signed)
Chronic problem.  Pt did not start Janumet as recommended when A1C jumped to 8.2, still on plain Metformin.  UTD on eye exam, microalbumin, foot exam.  Asymptomatic.  Check labs.  Adjust meds prn.

## 2014-08-19 NOTE — Assessment & Plan Note (Signed)
New.  Pt's location of pain is consistent w/ either biceps tendonitis or bursitis but must also consider referred pain.  Pt to start Mobic daily- take w/ food.  Will refer for orthopedic evaluation and tx.  Pt expressed understanding and is in agreement w/ plan.

## 2014-08-19 NOTE — Patient Instructions (Signed)
Follow up in 3-4 months to recheck diabetes We'll notify you of your lab results and make any changes if needed Start the Mobic daily (with food) for pain and inflammation We'll call you with your ortho appt Try and make healthy food choices and get regular exercise Call with any questions or concerns Happy 4th of July!!!

## 2014-08-19 NOTE — Progress Notes (Signed)
   Subjective:    Patient ID: Aliha S [place], female    DOB: 10/10/1975, 39 y.o.   MRN: [id number]  HPI DM- chronic problem.  Pt was switched to Janumet at last appt but did not take.  Currently on Metformin.  Not on ACE/ARB but UTD on microalbumin.  UTD on eye and foot exam.  Has lost 3 lbs.  Pt is not exercising regularly.  No recent dietary changes, 'i thought i was doing pretty good'.  No CP, SOB, HAs, visual changes, abd pain, N/V/D, numbness/tingling hands/feet.  L upper arm pain- occuring nightly when she lies on L side.  Will also occur if holding the phone in L hand.  Pain started 4-5 months ago.  sxs are worsening.  Pain will take breath away.   Review of Systems For ROS see HPI     Objective:   Physical Exam  Constitutional: She is oriented to person, place, and time. She appears well-developed and well-nourished. No distress.  HENT:  Head: Normocephalic and atraumatic.  Eyes: Conjunctivae and EOM are normal. Pupils are equal, round, and reactive to light.  Neck: Normal range of motion. Neck supple. No thyromegaly present.  Cardiovascular: Normal rate, regular rhythm, normal heart sounds and intact distal pulses.   No murmur heard. Pulmonary/Chest: Effort normal and breath sounds normal. No respiratory distress.  Abdominal: Soft. She exhibits no distension. There is no tenderness.  Musculoskeletal: She exhibits tenderness (TTP over L upper lateral arm, possible bursitis). She exhibits no edema.  Lymphadenopathy:    She has no cervical adenopathy.  Neurological: She is alert and oriented to person, place, and time.  Skin: Skin is warm and dry.  Psychiatric: She has a normal mood and affect. Her behavior is normal.  Vitals reviewed.         Assessment & Plan:

## 2014-08-21 ENCOUNTER — Encounter: Payer: Self-pay | Admitting: General Practice

## 2014-08-21 ENCOUNTER — Other Ambulatory Visit: Payer: BLUE CROSS/BLUE SHIELD

## 2014-08-21 LAB — HEPATIC FUNCTION PANEL
ALK PHOS: 46 U/L (ref 39–117)
ALT: 9 U/L (ref 0–35)
AST: 12 U/L (ref 0–37)
Albumin: 3.4 g/dL — ABNORMAL LOW (ref 3.5–5.2)
BILIRUBIN TOTAL: 0.9 mg/dL (ref 0.2–1.2)
Bilirubin, Direct: 0.2 mg/dL (ref 0.0–0.3)
Total Protein: 6.5 g/dL (ref 6.0–8.3)

## 2014-08-21 LAB — LIPID PANEL
Cholesterol: 144 mg/dL (ref 0–200)
HDL: 32.8 mg/dL — AB (ref >39.00)
LDL Cholesterol: 81 mg/dL (ref 0–99)
NonHDL: 111.2
TRIGLYCERIDES: 149 mg/dL (ref 0.0–149.0)
Total CHOL/HDL Ratio: 4
VLDL: 29.8 mg/dL (ref 0.0–40.0)

## 2014-08-21 LAB — HEMOGLOBIN A1C: Hgb A1c MFr Bld: 8 % — ABNORMAL HIGH (ref 4.6–6.5)

## 2014-08-21 LAB — BASIC METABOLIC PANEL
BUN: 12 mg/dL (ref 6–23)
CALCIUM: 8.6 mg/dL (ref 8.4–10.5)
CO2: 28 meq/L (ref 19–32)
Chloride: 102 mEq/L (ref 96–112)
Creatinine, Ser: 0.75 mg/dL (ref 0.40–1.20)
GFR: 110.04 mL/min (ref >60.00)
GLUCOSE: 192 mg/dL — AB (ref 70–99)
POTASSIUM: 3.7 meq/L (ref 3.5–5.1)
Sodium: 136 mEq/L (ref 135–145)

## 2014-09-04 ENCOUNTER — Telehealth: Payer: Self-pay | Admitting: Family Medicine

## 2014-09-04 DIAGNOSIS — J3489 Other specified disorders of nose and nasal sinuses: Secondary | ICD-10-CM

## 2014-09-04 NOTE — Telephone Encounter (Signed)
Referral has be faxed to [facility]

## 2014-09-04 NOTE — Telephone Encounter (Signed)
Referral placed. Was originally placed in November but pt did not make appt at that time.

## 2014-09-04 NOTE — Telephone Encounter (Signed)
Relation to pt: self  Call back number: [phone]   Reason for call:  [facility], patient has an appointment July 26th.

## 2014-09-10 ENCOUNTER — Ambulatory Visit
Admission: RE | Admit: 2014-09-10 | Discharge: 2014-09-10 | Disposition: A | Discharge status: Home / Self Care | Payer: BLUE CROSS/BLUE SHIELD | Source: Ambulatory Visit | Attending: Family Medicine | Admitting: Family Medicine

## 2014-09-10 DIAGNOSIS — N63 Unspecified lump in unspecified breast: Secondary | ICD-10-CM

## 2014-11-24 ENCOUNTER — Ambulatory Visit: Payer: BLUE CROSS/BLUE SHIELD | Admitting: Family Medicine

## 2014-11-24 ENCOUNTER — Telehealth: Payer: Self-pay | Admitting: Family Medicine

## 2014-11-24 DIAGNOSIS — Z0289 Encounter for other administrative examinations: Secondary | ICD-10-CM

## 2014-12-03 NOTE — Telephone Encounter (Signed)
Pt was no show 11/24/14 8:30am, follow up appt, pt has not rescheduled, 1st no show I see, charge or no charge?

## 2014-12-03 NOTE — Telephone Encounter (Signed)
Please charge pt

## 2014-12-29 ENCOUNTER — Other Ambulatory Visit: Payer: Self-pay | Admitting: Family Medicine

## 2015-02-25 ENCOUNTER — Emergency Department (HOSPITAL_COMMUNITY)
Admission: EM | Admit: 2015-02-25 | Discharge: 2015-02-25 | Discharge status: Against Medical Advice | Payer: BLUE CROSS/BLUE SHIELD | Source: Home / Self Care

## 2015-02-25 ENCOUNTER — Emergency Department (HOSPITAL_BASED_OUTPATIENT_CLINIC_OR_DEPARTMENT_OTHER)
Admission: EM | Admit: 2015-02-25 | Discharge: 2015-02-25 | Disposition: A | Discharge status: Home / Self Care | Payer: BLUE CROSS/BLUE SHIELD | Attending: Emergency Medicine | Admitting: Emergency Medicine

## 2015-02-25 ENCOUNTER — Encounter (HOSPITAL_BASED_OUTPATIENT_CLINIC_OR_DEPARTMENT_OTHER): Payer: Self-pay | Admitting: Emergency Medicine

## 2015-02-25 ENCOUNTER — Encounter (HOSPITAL_COMMUNITY): Payer: Self-pay | Admitting: *Deleted

## 2015-02-25 DIAGNOSIS — Z791 Long term (current) use of non-steroidal anti-inflammatories (NSAID): Secondary | ICD-10-CM | POA: Insufficient documentation

## 2015-02-25 DIAGNOSIS — E860 Dehydration: Secondary | ICD-10-CM | POA: Insufficient documentation

## 2015-02-25 DIAGNOSIS — R1110 Vomiting, unspecified: Secondary | ICD-10-CM

## 2015-02-25 DIAGNOSIS — E119 Type 2 diabetes mellitus without complications: Secondary | ICD-10-CM | POA: Insufficient documentation

## 2015-02-25 DIAGNOSIS — E1165 Type 2 diabetes mellitus with hyperglycemia: Secondary | ICD-10-CM | POA: Insufficient documentation

## 2015-02-25 DIAGNOSIS — R739 Hyperglycemia, unspecified: Secondary | ICD-10-CM

## 2015-02-25 DIAGNOSIS — Z7984 Long term (current) use of oral hypoglycemic drugs: Secondary | ICD-10-CM | POA: Insufficient documentation

## 2015-02-25 LAB — CBC WITH DIFFERENTIAL/PLATELET
Basophils Absolute: 0 10*3/uL (ref 0.0–0.1)
Basophils Relative: 0 %
Eosinophils Absolute: 0 10*3/uL (ref 0.0–0.7)
Eosinophils Relative: 0 %
HEMATOCRIT: 39 % (ref 36.0–46.0)
HEMOGLOBIN: 13.1 g/dL (ref 12.0–15.0)
LYMPHS ABS: 1.3 10*3/uL (ref 0.7–4.0)
LYMPHS PCT: 11 %
MCH: 26.2 pg (ref 26.0–34.0)
MCHC: 33.6 g/dL (ref 30.0–36.0)
MCV: 78 fL (ref 78.0–100.0)
MONOS PCT: 2 %
Monocytes Absolute: 0.2 10*3/uL (ref 0.1–1.0)
NEUTROS ABS: 10.6 10*3/uL — AB (ref 1.7–7.7)
NEUTROS PCT: 87 %
PLATELETS: 343 10*3/uL (ref 150–400)
RBC: 5 MIL/uL (ref 3.87–5.11)
RDW: 15.3 % (ref 11.5–15.5)
WBC: 12.2 10*3/uL — ABNORMAL HIGH (ref 4.0–10.5)

## 2015-02-25 LAB — COMPREHENSIVE METABOLIC PANEL
ALK PHOS: 65 U/L (ref 38–126)
ALT: 19 U/L (ref 14–54)
ALT: 20 U/L (ref 14–54)
ANION GAP: 9 (ref 5–15)
ANION GAP: 9 (ref 5–15)
AST: 20 U/L (ref 15–41)
AST: 19 U/L (ref 15–41)
Albumin: 3.5 g/dL (ref 3.5–5.0)
Albumin: 4.1 g/dL (ref 3.5–5.0)
Alkaline Phosphatase: 72 U/L (ref 38–126)
BUN: 11 mg/dL (ref 6–20)
BUN: 13 mg/dL (ref 6–20)
CALCIUM: 9.3 mg/dL (ref 8.9–10.3)
CHLORIDE: 99 mmol/L — AB (ref 101–111)
CHLORIDE: 100 mmol/L — AB (ref 101–111)
CO2: 26 mmol/L (ref 22–32)
CO2: 26 mmol/L (ref 22–32)
CREATININE: 0.78 mg/dL (ref 0.44–1.00)
Calcium: 9.2 mg/dL (ref 8.9–10.3)
Creatinine, Ser: 0.81 mg/dL (ref 0.44–1.00)
GFR calc non Af Amer: >60 mL/min (ref >60)
GFR calc non Af Amer: >60 mL/min (ref >60)
Glucose, Bld: 358 mg/dL — ABNORMAL HIGH (ref 65–99)
Glucose, Bld: 403 mg/dL — ABNORMAL HIGH (ref 65–99)
POTASSIUM: 4 mmol/L (ref 3.5–5.1)
Potassium: 4.3 mmol/L (ref 3.5–5.1)
SODIUM: 134 mmol/L — AB (ref 135–145)
SODIUM: 135 mmol/L (ref 135–145)
Total Bilirubin: 1.2 mg/dL (ref 0.3–1.2)
Total Bilirubin: 1.4 mg/dL — ABNORMAL HIGH (ref 0.3–1.2)
Total Protein: 7.9 g/dL (ref 6.5–8.1)
Total Protein: 8.5 g/dL — ABNORMAL HIGH (ref 6.5–8.1)

## 2015-02-25 LAB — CBG MONITORING, ED
GLUCOSE-CAPILLARY: 380 mg/dL — AB (ref 65–99)
GLUCOSE-CAPILLARY: 389 mg/dL — AB (ref 65–99)
GLUCOSE-CAPILLARY: 306 mg/dL — AB (ref 65–99)
Glucose-Capillary: 340 mg/dL — ABNORMAL HIGH (ref 65–99)

## 2015-02-25 LAB — I-STAT BETA HCG BLOOD, ED (MC, WL, AP ONLY)

## 2015-02-25 LAB — CBC
HCT: 37.8 % (ref 36.0–46.0)
HEMOGLOBIN: 12.9 g/dL (ref 12.0–15.0)
MCH: 26.8 pg (ref 26.0–34.0)
MCHC: 34.1 g/dL (ref 30.0–36.0)
MCV: 78.6 fL (ref 78.0–100.0)
Platelets: 314 10*3/uL (ref 150–400)
RBC: 4.81 MIL/uL (ref 3.87–5.11)
RDW: 15 % (ref 11.5–15.5)
WBC: 9.7 10*3/uL (ref 4.0–10.5)

## 2015-02-25 LAB — LIPASE, BLOOD: LIPASE: 22 U/L (ref 11–51)

## 2015-02-25 MED ORDER — ONDANSETRON HCL 4 MG/2ML IJ SOLN
4.0000 mg | Freq: Once | INTRAMUSCULAR | Status: AC
  Administered 2015-02-25: 4 mg via INTRAVENOUS
  Filled 2015-02-25: qty 2

## 2015-02-25 MED ORDER — ONDANSETRON 4 MG PO TBDP
4.0000 mg | ORAL_TABLET | Freq: Once | ORAL | Status: AC | PRN
  Administered 2015-02-25: 4 mg via ORAL

## 2015-02-25 MED ORDER — ONDANSETRON 4 MG PO TBDP
ORAL_TABLET | ORAL | Status: AC
  Filled 2015-02-25: qty 1

## 2015-02-25 MED ORDER — INSULIN ASPART 100 UNIT/ML ~~LOC~~ SOLN
5.0000 [IU] | Freq: Once | SUBCUTANEOUS | Status: AC
  Administered 2015-02-25: 5 [IU] via SUBCUTANEOUS
  Filled 2015-02-25: qty 1

## 2015-02-25 MED ORDER — SODIUM CHLORIDE 0.9 % IV BOLUS (SEPSIS)
2000.0000 mL | Freq: Once | INTRAVENOUS | Status: AC
  Administered 2015-02-25: 2000 mL via INTRAVENOUS

## 2015-02-25 NOTE — ED Provider Notes (Signed)
CSN: [id number]     Arrival date & time 02/25/15  1805 History   First MD Initiated Contact with Patient 02/25/15 1846     Chief Complaint  Patient presents with  . Hyperglycemia     (Consider location/radiation/quality/duration/timing/severity/associated sxs/prior Treatment) HPI Laban Emperorlyria S [place] is a 40 y.o. female with a history of diabetes on metformin comes in for evaluation of hyperglycemia. Patient reports since 11:00 this morning she has felt nauseous and has been vomiting. She believes that her blood sugar is high. She reports she has not been compliant with her metformin medication and has not been taking it over the past 2 months. She also reports that she has been drinking an increased amount of Kool-Aid. She reports that she is followed by Dr. Beverely Lowabori for this problem. She reports associated polydipsia, polyuria. She denies any unusual abdominal pain, states she is currently on her menstrual cycle and her discomfort is normal. No fevers, chills, confusion, chest pain, shortness of breath, dysuria, hematuria, diarrhea or constipation. No other modifying factors.  Past Medical History  Diagnosis Date  . Diabetes mellitus without complication [facility](HCC)    Past Surgical History  Procedure Laterality Date  . Cholecystectomy  2000   Family History  Problem Relation Age of Onset  . Lung cancer Mother   . Breast cancer Mother   . Deep vein thrombosis Mother   . Heart Problems Mother     had pacemaker  . Diabetes Mother   . Diabetes Father     controls with diet/exercise  . Alzheimer's disease Maternal Aunt   . Cancer Maternal Uncle     esophagus  . Alzheimer's disease Maternal Grandmother   . Hypertension Maternal Grandmother   . Cancer Maternal Grandfather     esophagus  . Diabetes Cousin    Social History  Substance Use Topics  . Smoking status: Never Smoker   . Smokeless tobacco: Never Used  . Alcohol Use: Yes     Comment: occ   OB History    No data available      Review of Systems A 10 point review of systems was completed and was negative except for pertinent positives and negatives as mentioned in the history of present illness     Allergies  Review of patient's allergies indicates no known allergies.  Home Medications   Prior to Admission medications   Medication Sig Start Date End Date Taking? Authorizing Provider  meloxicam (MOBIC) 15 MG tablet Take 1 tablet (15 mg total) by mouth daily. 08/19/14   Sheliah HatchKatherine E Tabori, MD  metFORMIN (GLUCOPHAGE) 1000 MG tablet TAKE 1 TABLET BY MOUTH TWICE DAILY WITH MEALS 12/30/14   Sheliah HatchKatherine E Tabori, MD   BP 161/89 mmHg  Pulse 80  Temp(Src) 98.9 F (37.2 C) (Oral)  Resp 18  Ht 5' 5.5" (1.664 m)  Wt 117.935 kg  BMI 42.59 kg/m2  SpO2 100%  LMP 02/25/2015 Physical Exam  Constitutional: She is oriented to person, place, and time. She appears well-developed and well-nourished.  HENT:  Head: Normocephalic and atraumatic.  Mildly dry mucous membranes.  Eyes: Conjunctivae are normal. Pupils are equal, round, and reactive to light. Right eye exhibits no discharge. Left eye exhibits no discharge. No scleral icterus.  Neck: Neck supple.  Cardiovascular: Normal rate, regular rhythm and normal heart sounds.   Pulmonary/Chest: Effort normal and breath sounds normal. No respiratory distress. She has no wheezes. She has no rales.  Abdominal: Soft. She exhibits no distension and no mass. There is  no tenderness. There is no rebound and no guarding.  Musculoskeletal: Normal range of motion. She exhibits no tenderness.  Neurological: She is alert and oriented to person, place, and time.  Cranial Nerves II-XII grossly intact  Skin: Skin is warm and dry. No rash noted. She is not diaphoretic.  Psychiatric: She has a normal mood and affect.  Nursing note and vitals reviewed.   ED Course  Procedures (including critical care time) Labs Review Labs Reviewed  COMPREHENSIVE METABOLIC PANEL - Abnormal; Notable for  the following:    Chloride 100 (*)    Glucose, Bld 403 (*)    Total Protein 8.5 (*)    Total Bilirubin 1.4 (*)    All other components within normal limits  CBC WITH DIFFERENTIAL/PLATELET - Abnormal; Notable for the following:    WBC 12.2 (*)    Neutro Abs 10.6 (*)    All other components within normal limits  CBG MONITORING, ED - Abnormal; Notable for the following:    Glucose-Capillary 380 (*)    All other components within normal limits  CBG MONITORING, ED - Abnormal; Notable for the following:    Glucose-Capillary 389 (*)    All other components within normal limits  CBG MONITORING, ED - Abnormal; Notable for the following:    Glucose-Capillary 306 (*)    All other components within normal limits    Imaging Review No results found. I have personally reviewed and evaluated these images and lab results as part of my medical decision-making.   EKG Interpretation None     Meds given in ED:  Medications  sodium chloride 0.9 % bolus 2,000 mL (2,000 mLs Intravenous New Bag/Given 02/25/15 2002)  ondansetron [facility]) injection 4 mg (4 mg Intravenous Given 02/25/15 2008)  insulin aspart (novoLOG) injection 5 Units (5 Units Subcutaneous Given 02/25/15 2110)  insulin aspart (novoLOG) injection 5 Units (5 Units Subcutaneous Given 02/25/15 2237)  ondansetron (ZOFRAN) injection 4 mg (4 mg Intravenous Given 02/25/15 2240)    New Prescriptions   No medications on file   Filed Vitals:   02/25/15 1818 02/25/15 2034  BP: 149/81 161/89  Pulse: 78 80  Temp: 98.9 F (37.2 C)   TempSrc: Oral   Resp: 18 18  Height: 5' 5.5" (1.664 m)   Weight: 117.935 kg   SpO2: 100% 100%    MDM  Nikcole Eischeid [place] is a 40 y.o. female with a history of diabetes and noncompliance on metformin medication. States she has been out of her medication for the past 2 months and has not followed up with her PCP. Presents today with nausea, vomiting and hyperglycemia. Glucose 380 on arrival, improved with 2 L of IV saline  and 5 units of NovoLog. Bicarbonate is 26, no anion gap. No evidence of DKA. Suspect mild leukocytosis is a stress response. No evidence of infection. Discussed with patient and husband at bedside and she will need to follow-up with her PCP tomorrow for medication reconciliation. Stressed the importance of medication compliance. We'll provide 1 more dose of insulin as patient will soon eat dinner. Discussed return precautions. Patient and husband verbalize understanding and agreement with this plan. No evidence of other acute or emergent pathology. Overall, patient appears well, nontoxic, hemodynamically stable and appropriate for discharge.  Final diagnoses:  Hyperglycemia        Joycie Peek, PA-C 02/25/15 2242  Arby Barrette, MD 02/26/15 [phone]

## 2015-02-25 NOTE — Discharge Instructions (Signed)
You were evaluated in the ED today for your high blood sugar, you responded well to insulin and IV fluids. The remainder of your labs were reassuring. It is imperative you follow-up with your doctor tomorrow for reevaluation and medication reconciliation for your diabetes. Return to ED for any new or worsening symptoms as we discussed.  Blood Glucose Monitoring, Adult Monitoring your blood glucose (also know as blood sugar) helps you to manage your diabetes. It also helps you and your health care provider monitor your diabetes and determine how well your treatment plan is working. WHY SHOULD YOU MONITOR YOUR BLOOD GLUCOSE?  It can help you understand how food, exercise, and medicine affect your blood glucose.  It allows you to know what your blood glucose is at any given moment. You can quickly tell if you are having low blood glucose (hypoglycemia) or high blood glucose (hyperglycemia).  It can help you and your health care provider know how to adjust your medicines.  It can help you understand how to manage an illness or adjust medicine for exercise. WHEN SHOULD YOU TEST? Your health care provider will help you decide how often you should check your blood glucose. This may depend on the type of diabetes you have, your diabetes control, or the types of medicines you are taking. Be sure to write down all of your blood glucose readings so that this information can be reviewed with your health care provider. See below for examples of testing times that your health care provider may suggest. Type 1 Diabetes  Test at least 2 times per day if your diabetes is well controlled, if you are using an insulin pump, or if you perform multiple daily injections.  If your diabetes is not well controlled or if you are sick, you may need to test more often.  It is a good idea to also test:  Before every insulin injection.  Before and after exercise.  Between meals and 2 hours after a meal.  Occasionally  between 2:00 a.m. and 3:00 a.m. Type 2 Diabetes  If you are taking insulin, test at least 2 times per day. However, it is best to test before every insulin injection.  If you take medicines by mouth (orally), test 2 times a day.  If you are on a controlled diet, test once a day.  If your diabetes is not well controlled or if you are sick, you may need to monitor more often. HOW TO MONITOR YOUR BLOOD GLUCOSE Supplies Needed  Blood glucose meter.  Test strips for your meter. Each meter has its own strips. You must use the strips that go with your own meter.  A pricking needle (lancet).  A device that holds the lancet (lancing device).  A journal or log book to write down your results. Procedure  Wash your hands with soap and water. Alcohol is not preferred.  Prick the side of your finger (not the tip) with the lancet.  Gently milk the finger until a small drop of blood appears.  Follow the instructions that come with your meter for inserting the test strip, applying blood to the strip, and using your blood glucose meter. Other Areas to Get Blood for Testing Some meters allow you to use other areas of your body (other than your finger) to test your blood. These areas are called alternative sites. The most common alternative sites are:  The forearm.  The thigh.  The back area of the lower leg.  The palm of the  hand. The blood flow in these areas is slower. Therefore, the blood glucose values you get may be delayed, and the numbers are different from what you would get from your fingers. Do not use alternative sites if you think you are having hypoglycemia. Your reading will not be accurate. Always use a finger if you are having hypoglycemia. Also, if you cannot feel your lows (hypoglycemia unawareness), always use your fingers for your blood glucose checks. ADDITIONAL TIPS FOR GLUCOSE MONITORING  Do not reuse lancets.  Always carry your supplies with you.  All blood  glucose meters have a 24-hour "hotline" number to call if you have questions or need help.  Adjust (calibrate) your blood glucose meter with a control solution after finishing a few boxes of strips. BLOOD GLUCOSE RECORD KEEPING It is a good idea to keep a daily record or log of your blood glucose readings. Most glucose meters, if not all, keep your glucose records stored in the meter. Some meters come with the ability to download your records to your home computer. Keeping a record of your blood glucose readings is especially helpful if you are wanting to look for patterns. Make notes to go along with the blood glucose readings because you might forget what happened at that exact time. Keeping good records helps you and your health care provider to work together to achieve good diabetes management.    This information is not intended to replace advice given to you by your health care provider. Make sure you discuss any questions you have with your health care provider.   Document Released: 02/09/2003 Document Revised: 02/27/2014 Document Reviewed: 07/01/2012 Elsevier Interactive Patient Education 2016 [employer]  Hyperglycemia Hyperglycemia occurs when the glucose (sugar) in your blood is too high. Hyperglycemia can happen for many reasons, but it most often happens to people who do not know they have diabetes or are not managing their diabetes properly.  CAUSES  Whether you have diabetes or not, there are other causes of hyperglycemia. Hyperglycemia can occur when you have diabetes, but it can also occur in other situations that you might not be as aware of, such as: Diabetes  If you have diabetes and are having problems controlling your blood glucose, hyperglycemia could occur because of some of the following reasons:  Not following your meal plan.  Not taking your diabetes medications or not taking it properly.  Exercising less or doing less activity than you normally do.  Being  sick. Pre-diabetes  This cannot be ignored. Before people develop Type 2 diabetes, they almost always have "pre-diabetes." This is when your blood glucose levels are higher than normal, but not yet high enough to be diagnosed as diabetes. Research has shown that some long-term damage to the body, especially the heart and circulatory system, may already be occurring during pre-diabetes. If you take action to manage your blood glucose when you have pre-diabetes, you may delay or prevent Type 2 diabetes from developing. Stress  If you have diabetes, you may be "diet" controlled or on oral medications or insulin to control your diabetes. However, you may find that your blood glucose is higher than usual in the hospital whether you have diabetes or not. This is often referred to as "stress hyperglycemia." Stress can elevate your blood glucose. This happens because of hormones put out by the body during times of stress. If stress has been the cause of your high blood glucose, it can be followed regularly by your caregiver. That way he/she  can make sure your hyperglycemia does not continue to get worse or progress to diabetes. Steroids  Steroids are medications that act on the infection fighting system (immune system) to block inflammation or infection. One side effect can be a rise in blood glucose. Most people can produce enough extra insulin to allow for this rise, but for those who cannot, steroids make blood glucose levels go even higher. It is not unusual for steroid treatments to "uncover" diabetes that is developing. It is not always possible to determine if the hyperglycemia will go away after the steroids are stopped. A special blood test called an A1c is sometimes done to determine if your blood glucose was elevated before the steroids were started. SYMPTOMS  Thirsty.  Frequent urination.  Dry mouth.  Blurred vision.  Tired or fatigue.  Weakness.  Sleepy.  Tingling in feet or  leg. DIAGNOSIS  Diagnosis is made by monitoring blood glucose in one or all of the following ways:  A1c test. This is a chemical found in your blood.  Fingerstick blood glucose monitoring.  Laboratory results. TREATMENT  First, knowing the cause of the hyperglycemia is important before the hyperglycemia can be treated. Treatment may include, but is not be limited to:  Education.  Change or adjustment in medications.  Change or adjustment in meal plan.  Treatment for an illness, infection, etc.  More frequent blood glucose monitoring.  Change in exercise plan.  Decreasing or stopping steroids.  Lifestyle changes. HOME CARE INSTRUCTIONS   Test your blood glucose as directed.  Exercise regularly. Your caregiver will give you instructions about exercise. Pre-diabetes or diabetes which comes on with stress is helped by exercising.  Eat wholesome, balanced meals. Eat often and at regular, fixed times. Your caregiver or nutritionist will give you a meal plan to guide your sugar intake.  Being at an ideal weight is important. If needed, losing as little as 10 to 15 pounds may help improve blood glucose levels. SEEK MEDICAL CARE IF:   You have questions about medicine, activity, or diet.  You continue to have symptoms (problems such as increased thirst, urination, or weight gain). SEEK IMMEDIATE MEDICAL CARE IF:   You are vomiting or have diarrhea.  Your breath smells fruity.  You are breathing faster or slower.  You are very sleepy or incoherent.  You have numbness, tingling, or pain in your feet or hands.  You have chest pain.  Your symptoms get worse even though you have been following your caregiver's orders.  If you have any other questions or concerns.   This information is not intended to replace advice given to you by your health care provider. Make sure you discuss any questions you have with your health care provider.   Document Released: 08/02/2000  Document Revised: 05/01/2011 Document Reviewed: 10/13/2014 Elsevier Interactive Patient Education [employer]

## 2015-02-25 NOTE — ED Notes (Signed)
Pt states that she has been vomiting since 1000, 7 times total. Has not been tolerating fluids.

## 2015-02-25 NOTE — ED Notes (Signed)
CBG 389 

## 2015-02-25 NOTE — ED Notes (Signed)
Patient states that she has been throwing up since about 10 am. The patient reports that she is having a high BS and lower back pain

## 2015-05-01 ENCOUNTER — Other Ambulatory Visit: Payer: Self-pay | Admitting: Family Medicine

## 2015-05-03 NOTE — Telephone Encounter (Signed)
Medication filled to pharmacy as requested.   

## 2015-07-02 ENCOUNTER — Other Ambulatory Visit: Payer: Self-pay | Admitting: Family Medicine

## 2015-07-02 DIAGNOSIS — N6310 Unspecified lump in the right breast, unspecified quadrant: Secondary | ICD-10-CM

## 2015-07-08 ENCOUNTER — Ambulatory Visit
Admission: RE | Admit: 2015-07-08 | Discharge: 2015-07-08 | Disposition: A | Discharge status: Home / Self Care | Payer: BLUE CROSS/BLUE SHIELD | Source: Ambulatory Visit | Attending: Family Medicine | Admitting: Family Medicine

## 2015-07-08 DIAGNOSIS — N6310 Unspecified lump in the right breast, unspecified quadrant: Secondary | ICD-10-CM

## 2015-07-13 ENCOUNTER — Encounter: Payer: Self-pay | Admitting: Family Medicine

## 2015-07-13 ENCOUNTER — Ambulatory Visit (INDEPENDENT_AMBULATORY_CARE_PROVIDER_SITE_OTHER): Payer: BLUE CROSS/BLUE SHIELD | Admitting: Family Medicine

## 2015-07-13 VITALS — BP 130/76 | HR 76 | Temp 98.1°F | Resp 16 | Ht 65.0 in | Wt 258.0 lb

## 2015-07-13 DIAGNOSIS — M722 Plantar fascial fibromatosis: Secondary | ICD-10-CM

## 2015-07-13 DIAGNOSIS — E1165 Type 2 diabetes mellitus with hyperglycemia: Secondary | ICD-10-CM

## 2015-07-13 DIAGNOSIS — IMO0001 Reserved for inherently not codable concepts without codable children: Secondary | ICD-10-CM

## 2015-07-13 NOTE — Progress Notes (Signed)
Pre visit review using our clinic review tool, if applicable. No additional management support is needed unless otherwise documented below in the visit note. 

## 2015-07-13 NOTE — Patient Instructions (Signed)
Schedule your complete physical in 3-4 months We'll notify you of your lab results and make any changes if needed Try and make healthy food choices and get regular exercise Call [facility] at [phone] for your shoulder pain and heel pain The heel pain is consistent w/ plantar fasciitis- wear supportive shoes w/ good arches (avoid bare feet and flip flops!), roll your foot over a frozen water bottle for pain relief Schedule your eye exam! Call with any questions or concerns Happy Memorial Day!!!

## 2015-07-13 NOTE — Progress Notes (Signed)
   Subjective:    Patient ID: Truc S [place], female    DOB: 03/06/1975, 40 y.o.   MRN: [id number]  HPI DM- chronic problem, pt has not been compliant in follow up.  Last visit 10 months ago.  On Metformin twice daily.  Due for eye exam, foot exam, microalbumin.  Pt had episode of hyperglycemia that required trip to ER due to vomiting.  Not checking CBGs.  Due for repeat eye exam- plans to schedule.  Denies numbness/tingling of hands/feet.  Denies symptomatic lows.  No CP, SOB, HAs, visual changes, abd pain, N/V/D.  No regular exercise- working 2 jobs.  Obesity- chronic problem.  Pt admits to no exercise and poor diet.  R heel pain- pt's pain is worse after prolonged sitting and then standing will cause sharp pain.  No swelling.  No known injury.    Review of Systems For ROS see HPI     Objective:   Physical Exam  Constitutional: She is oriented to person, place, and time. She appears well-developed and well-nourished. No distress.  HENT:  Head: Normocephalic and atraumatic.  Eyes: Conjunctivae and EOM are normal. Pupils are equal, round, and reactive to light.  Neck: Normal range of motion. Neck supple. No thyromegaly present.  Cardiovascular: Normal rate, regular rhythm, normal heart sounds and intact distal pulses.   No murmur heard. Pulmonary/Chest: Effort normal and breath sounds normal. No respiratory distress.  Abdominal: Soft. She exhibits no distension. There is no tenderness.  Musculoskeletal: She exhibits tenderness (TTP over origin of longitudinal arch on R). She exhibits no edema.  Lymphadenopathy:    She has no cervical adenopathy.  Neurological: She is alert and oriented to person, place, and time.  Skin: Skin is warm and dry.  Psychiatric: She has a normal mood and affect. Her behavior is normal.  Vitals reviewed.         Assessment & Plan:

## 2015-07-14 ENCOUNTER — Other Ambulatory Visit: Payer: Self-pay | Admitting: General Practice

## 2015-07-14 LAB — CBC WITH DIFFERENTIAL/PLATELET
BASOS ABS: 0 10*3/uL (ref 0.0–0.1)
BASOS PCT: 0.5 % (ref 0.0–3.0)
EOS ABS: 0.1 10*3/uL (ref 0.0–0.7)
Eosinophils Relative: 1.1 % (ref 0.0–5.0)
HEMATOCRIT: 35.9 % — AB (ref 36.0–46.0)
HEMOGLOBIN: 11.9 g/dL — AB (ref 12.0–15.0)
LYMPHS PCT: 26.2 % (ref 12.0–46.0)
Lymphs Abs: 2.2 10*3/uL (ref 0.7–4.0)
MCHC: 33.2 g/dL (ref 30.0–36.0)
MCV: 79.8 fl (ref 78.0–100.0)
MONO ABS: 0.4 10*3/uL (ref 0.1–1.0)
Monocytes Relative: 4.5 % (ref 3.0–12.0)
Neutro Abs: 5.7 10*3/uL (ref 1.4–7.7)
Neutrophils Relative %: 67.7 % (ref 43.0–77.0)
PLATELETS: 308 10*3/uL (ref 150.0–400.0)
RBC: 4.49 Mil/uL (ref 3.87–5.11)
RDW: 15.2 % (ref 11.5–15.5)
WBC: 8.4 10*3/uL (ref 4.0–10.5)

## 2015-07-14 LAB — BASIC METABOLIC PANEL
BUN: 12 mg/dL (ref 6–23)
CALCIUM: 9.4 mg/dL (ref 8.4–10.5)
CHLORIDE: 100 meq/L (ref 96–112)
CO2: 25 meq/L (ref 19–32)
CREATININE: 0.84 mg/dL (ref 0.40–1.20)
GFR: 96.11 mL/min (ref >60.00)
GLUCOSE: 376 mg/dL — AB (ref 70–99)
Potassium: 4.3 mEq/L (ref 3.5–5.1)
Sodium: 132 mEq/L — ABNORMAL LOW (ref 135–145)

## 2015-07-14 LAB — LDL CHOLESTEROL, DIRECT: Direct LDL: 110 mg/dL

## 2015-07-14 LAB — MICROALBUMIN / CREATININE URINE RATIO
CREATININE, U: 67.2 mg/dL
MICROALB/CREAT RATIO: 1 mg/g (ref 0.0–30.0)

## 2015-07-14 LAB — HEMOGLOBIN A1C: HEMOGLOBIN A1C: 9.2 % — AB (ref 4.6–6.5)

## 2015-07-14 LAB — LIPID PANEL
CHOLESTEROL: 172 mg/dL (ref 0–200)
HDL: 34.2 mg/dL — AB (ref >39.00)
NONHDL: 138.05
TRIGLYCERIDES: 257 mg/dL — AB (ref 0.0–149.0)
Total CHOL/HDL Ratio: 5
VLDL: 51.4 mg/dL — ABNORMAL HIGH (ref 0.0–40.0)

## 2015-07-14 LAB — HEPATIC FUNCTION PANEL
ALBUMIN: 3.9 g/dL (ref 3.5–5.2)
ALT: 11 U/L (ref 0–35)
AST: 11 U/L (ref 0–37)
Alkaline Phosphatase: 62 U/L (ref 39–117)
BILIRUBIN DIRECT: 0.1 mg/dL (ref 0.0–0.3)
TOTAL PROTEIN: 6.9 g/dL (ref 6.0–8.3)
Total Bilirubin: 0.9 mg/dL (ref 0.2–1.2)

## 2015-07-14 LAB — TSH: TSH: 1.27 u[IU]/mL (ref 0.35–4.50)

## 2015-07-14 MED ORDER — EMPAGLIFLOZIN-METFORMIN HCL 5-1000 MG PO TABS: 1.0000 | ORAL_TABLET | Freq: Two times a day (BID) | ORAL | Status: DC

## 2015-07-14 NOTE — Assessment & Plan Note (Signed)
Pt has not been compliant w/ follow up or medication.  Due for eye exam- pt plans to schedule at her convenience.  Due for microalbumin.  Foot exam done today.  Stressed need for healthy diet and regular exercise.  Check labs.  Adjust meds prn

## 2015-07-14 NOTE — Assessment & Plan Note (Signed)
New.  Pt's sxs and PE consistent w/ dx.  Encouraged NSAIDs, ice, stretching.  If no improvement, she plans to call ortho for complete evaluation and tx.

## 2015-07-14 NOTE — Assessment & Plan Note (Signed)
Chronic problem.  Again stressed the need for healthy diet and regular exercise- pt is doing neither.  Discussed the impact that this has on her overall health, her diabetes, and her well being.  Will continue to follow.

## 2015-07-21 ENCOUNTER — Other Ambulatory Visit: Payer: Self-pay | Admitting: General Practice

## 2015-07-21 ENCOUNTER — Telehealth: Payer: Self-pay | Admitting: Family Medicine

## 2015-07-21 MED ORDER — METFORMIN HCL 1000 MG PO TABS: 1000.0000 mg | ORAL_TABLET | Freq: Two times a day (BID) | ORAL | Status: DC

## 2015-07-21 NOTE — Telephone Encounter (Signed)
Patient notified of PCP recommendations and is agreement and expresses an understanding.  

## 2015-07-21 NOTE — Telephone Encounter (Signed)
I thought pt was taking her Metformin- the new prescription was an add on.  She can continue taking the Metformin twice daily but she needs to make sure she is taking it as prescribed and working on healthy diet and regular exercise.

## 2015-07-21 NOTE — Telephone Encounter (Signed)
Pt asking if she could go back on the metformin next month, the cost with ins is cheaper than the new Rx Dr Beverely Lowabori gave her.

## 2015-09-01 LAB — HM DIABETES EYE EXAM

## 2015-09-03 ENCOUNTER — Encounter: Payer: Self-pay | Admitting: Family Medicine

## 2015-09-03 ENCOUNTER — Ambulatory Visit (INDEPENDENT_AMBULATORY_CARE_PROVIDER_SITE_OTHER): Payer: BLUE CROSS/BLUE SHIELD | Admitting: Family Medicine

## 2015-09-03 VITALS — BP 126/82 | HR 76 | Temp 98.1°F | Resp 16 | Ht 65.0 in | Wt 253.0 lb

## 2015-09-03 DIAGNOSIS — Z0000 Encounter for general adult medical examination without abnormal findings: Secondary | ICD-10-CM

## 2015-09-03 MED ORDER — RANITIDINE HCL 300 MG PO TABS: 300.0000 mg | ORAL_TABLET | Freq: Every day | ORAL | Status: DC

## 2015-09-03 NOTE — Progress Notes (Signed)
Pre visit review using our clinic review tool, if applicable. No additional management support is needed unless otherwise documented below in the visit note. 

## 2015-09-03 NOTE — Patient Instructions (Signed)
Follow up in mid-September to recheck your diabetes Continue to work on healthy diet and regular exercise- you can do it! Add the Ranitidine nightly to decrease acid reflux Call with any questions or concerns Have a great trip!!!

## 2015-09-03 NOTE — Progress Notes (Signed)
   Subjective:    Patient ID: Belinda Reilly, female    DOB: 04/17/1975, 40 y.o.   MRN: [id number]  HPI CPE- pt declines pap today.  UTD on mammo.  UTD on foot exam, eye exam.  Pt has lost 5 lbs since last visit!   Review of Systems Patient reports no vision/ hearing changes, adenopathy,fever, weight change,  persistant/recurrent hoarseness , swallowing issues, chest pain, palpitations, edema, persistant/recurrent cough, hemoptysis, dyspnea (rest/exertional/paroxysmal nocturnal), gastrointestinal bleeding (melena, rectal bleeding), abdominal pain, bowel changes, GU symptoms (dysuria, hematuria, incontinence), Gyn symptoms (abnormal  bleeding, pain),  syncope, focal weakness, memory loss, numbness & tingling, skin/hair/nail changes, abnormal bruising or bleeding, anxiety, or depression.   + GERD- pt had episode that lasted Saturday-Wednesday    Objective:   Physical Exam General Appearance:    Alert, cooperative, no distress, appears stated age, obese  Head:    Normocephalic, without obvious abnormality, atraumatic  Eyes:    PERRL, conjunctiva/corneas clear, EOM's intact, fundi    benign, both eyes  Ears:    Normal TM's and external ear canals, both ears  Nose:   Nares normal, septum midline, mucosa normal, no drainage    or sinus tenderness  Throat:   Lips, mucosa, and tongue normal; teeth and gums normal  Neck:   Supple, symmetrical, trachea midline, no adenopathy;    Thyroid: no enlargement/tenderness/nodules  Back:     Symmetric, no curvature, ROM normal, no CVA tenderness  Lungs:     Clear to auscultation bilaterally, respirations unlabored  Chest Wall:    No tenderness or deformity   Heart:    Regular rate and rhythm, S1 and S2 normal, no murmur, rub   or gallop  Breast Exam:    Deferred to mammo  Abdomen:     Soft, non-tender, bowel sounds active all four quadrants,    no masses, no organomegaly  Genitalia:    Deferred at pt's request  Rectal:    Extremities:   Extremities  normal, atraumatic, no cyanosis or edema  Pulses:   2+ and symmetric all extremities  Skin:   Skin color, texture, turgor normal, no rashes or lesions  Lymph nodes:   Cervical, supraclavicular, and axillary nodes normal  Neurologic:   CNII-XII intact, normal strength, sensation and reflexes    throughout          Assessment & Plan:

## 2015-09-03 NOTE — Assessment & Plan Note (Signed)
Pt's PE WNL w/ exception of obesity.  EKG done as baseline- WNL.  Reviewed recent labs.  Stressed need for healthy diet and regular exercise.  UTD on mammo.  Due for pap but pt declines.  Anticipatory guidance provided.

## 2015-10-06 ENCOUNTER — Encounter: Payer: Self-pay | Admitting: Family Medicine

## 2015-10-06 LAB — HM DIABETES EYE EXAM

## 2015-10-12 ENCOUNTER — Ambulatory Visit: Payer: BLUE CROSS/BLUE SHIELD | Admitting: Family Medicine

## 2015-10-14 ENCOUNTER — Encounter: Payer: Self-pay | Admitting: Family Medicine

## 2015-10-14 ENCOUNTER — Ambulatory Visit (INDEPENDENT_AMBULATORY_CARE_PROVIDER_SITE_OTHER): Payer: BLUE CROSS/BLUE SHIELD | Admitting: Family Medicine

## 2015-10-14 VITALS — BP 122/80 | HR 92 | Temp 98.4°F | Resp 16 | Ht 65.0 in | Wt 255.4 lb

## 2015-10-14 DIAGNOSIS — J9801 Acute bronchospasm: Secondary | ICD-10-CM

## 2015-10-14 MED ORDER — ALBUTEROL SULFATE HFA 108 (90 BASE) MCG/ACT IN AERS: 2.0000 | INHALATION_SPRAY | RESPIRATORY_TRACT | 6 refills | Status: DC | PRN

## 2015-10-14 MED ORDER — ALBUTEROL SULFATE (2.5 MG/3ML) 0.083% IN NEBU
2.5000 mg | INHALATION_SOLUTION | Freq: Once | RESPIRATORY_TRACT | Status: AC
  Administered 2015-10-14: 2.5 mg via RESPIRATORY_TRACT

## 2015-10-14 NOTE — Progress Notes (Signed)
Pre visit review using our clinic review tool, if applicable. No additional management support is needed unless otherwise documented below in the visit note. 

## 2015-10-14 NOTE — Progress Notes (Signed)
   Subjective:    Patient ID: Belinda Reilly, female    DOB: 09/20/1975, 40 y.o.   MRN: [id number]  HPI Cough- sxs started ~3 weeks ago when [place] from [place].  Cough is intermittently productive.  Taking Claritin.  Has used a bottle to Dayquil, a bag of Halls, [place].  No fevers.  Denies sinus pain.  + nasal congestion and drainage.  Denies HAs.  No known sick contacts.  + wheezing.   Review of Systems For ROS see HPI     Objective:   Physical Exam  Constitutional: She is oriented to person, place, and time. She appears well-developed and well-nourished. No distress.  HENT:  Head: Normocephalic and atraumatic.  TMs normal bilaterally Mild nasal congestion Throat w/out erythema, edema, or exudate  Eyes: Conjunctivae and EOM are normal. Pupils are equal, round, and reactive to light.  Neck: Normal range of motion. Neck supple.  Cardiovascular: Normal rate, regular rhythm, normal heart sounds and intact distal pulses.   No murmur heard. Pulmonary/Chest: Effort normal and breath sounds normal. No respiratory distress. She has no wheezes.  + dry cough Decreased air movement throughout w/o crackles/wheezing- improved s/p neb tx  Lymphadenopathy:    She has no cervical adenopathy.  Neurological: She is alert and oriented to person, place, and time.  Skin: Skin is warm and dry.  Psychiatric: She has a normal mood and affect. Her behavior is normal. Thought content normal.  Vitals reviewed.         Assessment & Plan:  Cough due to bronchospasm- cough improved s/p neb tx in office.  No evidence of infxn.  Suspect this is due to allergy inflammation.  Start albuterol.  Ibuprofen for inflammation.  No need for abx.  Reviewed supportive care and red flags that should prompt return.  Pt expressed understanding and is in agreement w/ plan.

## 2015-10-14 NOTE — Patient Instructions (Signed)
Follow up as scheduled Start the Albuterol inhaler- 2 puffs every 4-6 hrs for cough or shortness of breath Ibuprofen for airway inflammation- 200mg  (1 tab) 3x/day until cough improved Drink plenty of fluids Continue the Claritin Call with any questions or concerns Hang in there!!!

## 2015-11-11 ENCOUNTER — Ambulatory Visit (INDEPENDENT_AMBULATORY_CARE_PROVIDER_SITE_OTHER): Payer: BLUE CROSS/BLUE SHIELD | Admitting: Family Medicine

## 2015-11-11 ENCOUNTER — Encounter: Payer: Self-pay | Admitting: Family Medicine

## 2015-11-11 VITALS — BP 120/81 | HR 76 | Temp 98.1°F | Resp 16 | Ht 65.0 in | Wt 259.4 lb

## 2015-11-11 DIAGNOSIS — E1165 Type 2 diabetes mellitus with hyperglycemia: Secondary | ICD-10-CM

## 2015-11-11 DIAGNOSIS — IMO0001 Reserved for inherently not codable concepts without codable children: Secondary | ICD-10-CM

## 2015-11-11 LAB — BASIC METABOLIC PANEL
BUN: 13 mg/dL (ref 6–23)
CALCIUM: 8.7 mg/dL (ref 8.4–10.5)
CO2: 28 meq/L (ref 19–32)
CREATININE: 0.75 mg/dL (ref 0.40–1.20)
Chloride: 99 mEq/L (ref 96–112)
GFR: 109.36 mL/min (ref >60.00)
GLUCOSE: 287 mg/dL — AB (ref 70–99)
Potassium: 4 mEq/L (ref 3.5–5.1)
Sodium: 132 mEq/L — ABNORMAL LOW (ref 135–145)

## 2015-11-11 LAB — HEMOGLOBIN A1C: HEMOGLOBIN A1C: 9.1 % — AB (ref 4.6–6.5)

## 2015-11-11 NOTE — Patient Instructions (Signed)
Follow up in 3-4 months to recheck diabetes and cholesterol We'll notify you of your lab results and make any changes if needed Continue to work on healthy diet and regular exercise- you can do it!! Call with any questions or concerns Have fun in [place]!!!!

## 2015-11-11 NOTE — Progress Notes (Signed)
Pre visit review using our clinic review tool, if applicable. No additional management support is needed unless otherwise documented below in the visit note. 

## 2015-11-11 NOTE — Assessment & Plan Note (Signed)
Chronic problem.  Not well controlled.  Pt has hx of noncompliance w/ medication changes (such as not taking the Synjardy) and remains on just metformin- which is not enough.  Check labs to determine how much A1C reduction is needed and adjust meds accordingly.  If very high, will need Endo referral.  UTD on eye exam, foot exam, microalbumin.  Will follow closely.

## 2015-11-11 NOTE — Progress Notes (Signed)
   Subjective:    Patient ID: Belinda Reilly, female    DOB: 09/22/1975, 40 y.o.   MRN: [id number]  HPI DM- chronic problem.  Last A1C 9.2 and pt was switched to [place] but she did not take this due to cost.  UTD on eye exam, foot exam, microalbumin.  Pt has gained 4 lbs since last visit.  Not checking home CBGs.  Denies symptomatic lows.  No CP, SOB, HAs, visual changes, numbness/tingling hand/feet.  No regular exercise.   Review of Systems For ROS see HPI     Objective:   Physical Exam  Constitutional: She is oriented to person, place, and time. She appears well-developed and well-nourished. No distress.  HENT:  Head: Normocephalic and atraumatic.  Eyes: Conjunctivae and EOM are normal. Pupils are equal, round, and reactive to light.  Neck: Normal range of motion. Neck supple. No thyromegaly present.  Cardiovascular: Normal rate, regular rhythm, normal heart sounds and intact distal pulses.   No murmur heard. Pulmonary/Chest: Effort normal and breath sounds normal. No respiratory distress.  Abdominal: Soft. She exhibits no distension. There is no tenderness.  Musculoskeletal: She exhibits no edema.  Lymphadenopathy:    She has no cervical adenopathy.  Neurological: She is alert and oriented to person, place, and time.  Skin: Skin is warm and dry.  Psychiatric: She has a normal mood and affect. Her behavior is normal.  Vitals reviewed.         Assessment & Plan:

## 2015-11-12 ENCOUNTER — Other Ambulatory Visit: Payer: Self-pay | Admitting: General Practice

## 2015-11-12 MED ORDER — EMPAGLIFLOZIN 10 MG PO TABS: 10.0000 mg | ORAL_TABLET | Freq: Every day | ORAL | 6 refills | Status: DC

## 2016-03-09 ENCOUNTER — Ambulatory Visit: Payer: BLUE CROSS/BLUE SHIELD | Admitting: Family Medicine

## 2016-03-13 ENCOUNTER — Telehealth: Payer: Self-pay | Admitting: *Deleted

## 2016-03-13 NOTE — Telephone Encounter (Signed)
Patient called stating that she is overdue for her PAP and needs to be seen.   I did not see any 30 minute appointments, and patient wanted something before the next few months.  Routing to provider to advise if there are any other times that could be merged for her appointment.

## 2016-03-13 NOTE — Telephone Encounter (Signed)
Ok to schedule for 1/30 at 11:30 (her appt got cancelled due to weather)

## 2016-03-20 ENCOUNTER — Other Ambulatory Visit: Payer: Self-pay | Admitting: Family Medicine

## 2016-03-20 DIAGNOSIS — Z1231 Encounter for screening mammogram for malignant neoplasm of breast: Secondary | ICD-10-CM

## 2016-03-21 ENCOUNTER — Other Ambulatory Visit (HOSPITAL_COMMUNITY)
Admission: RE | Admit: 2016-03-21 | Discharge: 2016-03-21 | Disposition: A | Discharge status: Home / Self Care | Payer: BLUE CROSS/BLUE SHIELD | Source: Ambulatory Visit | Attending: Family Medicine | Admitting: Family Medicine

## 2016-03-21 ENCOUNTER — Ambulatory Visit (INDEPENDENT_AMBULATORY_CARE_PROVIDER_SITE_OTHER): Payer: BLUE CROSS/BLUE SHIELD | Admitting: Family Medicine

## 2016-03-21 ENCOUNTER — Encounter: Payer: Self-pay | Admitting: Family Medicine

## 2016-03-21 VITALS — BP 122/82 | HR 89 | Temp 98.8°F | Resp 16 | Ht 65.0 in | Wt 262.0 lb

## 2016-03-21 DIAGNOSIS — Z202 Contact with and (suspected) exposure to infections with a predominantly sexual mode of transmission: Secondary | ICD-10-CM | POA: Diagnosis not present

## 2016-03-21 DIAGNOSIS — E1165 Type 2 diabetes mellitus with hyperglycemia: Secondary | ICD-10-CM | POA: Diagnosis not present

## 2016-03-21 DIAGNOSIS — Z30011 Encounter for initial prescription of contraceptive pills: Secondary | ICD-10-CM | POA: Diagnosis not present

## 2016-03-21 DIAGNOSIS — Z01419 Encounter for gynecological examination (general) (routine) without abnormal findings: Secondary | ICD-10-CM | POA: Insufficient documentation

## 2016-03-21 DIAGNOSIS — Z124 Encounter for screening for malignant neoplasm of cervix: Secondary | ICD-10-CM | POA: Diagnosis not present

## 2016-03-21 DIAGNOSIS — Z113 Encounter for screening for infections with a predominantly sexual mode of transmission: Secondary | ICD-10-CM | POA: Insufficient documentation

## 2016-03-21 DIAGNOSIS — Z1151 Encounter for screening for human papillomavirus (HPV): Secondary | ICD-10-CM | POA: Diagnosis present

## 2016-03-21 DIAGNOSIS — IMO0001 Reserved for inherently not codable concepts without codable children: Secondary | ICD-10-CM

## 2016-03-21 LAB — LDL CHOLESTEROL, DIRECT: Direct LDL: 86 mg/dL

## 2016-03-21 LAB — HEPATIC FUNCTION PANEL
ALK PHOS: 68 U/L (ref 39–117)
ALT: 30 U/L (ref 0–35)
AST: 27 U/L (ref 0–37)
Albumin: 3.7 g/dL (ref 3.5–5.2)
BILIRUBIN DIRECT: 0.2 mg/dL (ref 0.0–0.3)
Total Bilirubin: 1.3 mg/dL — ABNORMAL HIGH (ref 0.2–1.2)
Total Protein: 7.1 g/dL (ref 6.0–8.3)

## 2016-03-21 LAB — LIPID PANEL
CHOL/HDL RATIO: 5
Cholesterol: 163 mg/dL (ref 0–200)
HDL: 36.1 mg/dL — ABNORMAL LOW (ref >39.00)
NONHDL: 126.85
Triglycerides: 369 mg/dL — ABNORMAL HIGH (ref 0.0–149.0)
VLDL: 73.8 mg/dL — ABNORMAL HIGH (ref 0.0–40.0)

## 2016-03-21 LAB — BASIC METABOLIC PANEL
BUN: 12 mg/dL (ref 6–23)
CALCIUM: 8.8 mg/dL (ref 8.4–10.5)
CHLORIDE: 99 meq/L (ref 96–112)
CO2: 28 meq/L (ref 19–32)
CREATININE: 0.75 mg/dL (ref 0.40–1.20)
GFR: 109.17 mL/min (ref >60.00)
Glucose, Bld: 412 mg/dL — ABNORMAL HIGH (ref 70–99)
Potassium: 4.1 mEq/L (ref 3.5–5.1)
Sodium: 130 mEq/L — ABNORMAL LOW (ref 135–145)

## 2016-03-21 LAB — HEMOGLOBIN A1C: Hgb A1c MFr Bld: 9.8 % — ABNORMAL HIGH (ref 4.6–6.5)

## 2016-03-21 LAB — TSH: TSH: 1.17 u[IU]/mL (ref 0.35–4.50)

## 2016-03-21 MED ORDER — NORETHINDRONE ACET-ETHINYL EST 1-20 MG-MCG PO TABS: 1.0000 | ORAL_TABLET | Freq: Every day | ORAL | 11 refills | Status: DC

## 2016-03-21 NOTE — Patient Instructions (Addendum)
Follow up in 3-4 months to recheck diabetes We'll notify you of your lab results and make any changes if needed Start the birth control the Sunday after you start your next period (you can still be bleeding).  For example, period starts on Saturday- take the pill the next day.  Period starts on Monday- wait until Sunday.  If you start bleeding on Sunday, you can also start the pill that day. Continue to work on healthy diet and regular exercise- you can do it! Call with any questions or concerns Happy New Year!!!

## 2016-03-21 NOTE — Progress Notes (Signed)
   Subjective:    Patient ID: Belinda Reilly [place], female    DOB: 02/02/1976, 41 y.o.   MRN: [id number]  HPI DM- chronic problem, on Metformin twice daily.  Never started the Jardiance.  UTD on foot exam, eye exam, microalbumin.  Denies symptomatic lows.  No CP, SOB, HAs, visual changes, edema.  No regular exercise.  Pt feels like she'Reilly eating better.  Pap- due for pap and breast exam.  New relationship- pt is asking for STD test and to start OCPs as she separated from her husband and is now sexually active.  Review of Systems For ROS see HPI     Objective:   Physical Exam  Constitutional: She is oriented to person, place, and time. She appears well-developed and well-nourished. No distress.  obese  Abdominal: Soft. She exhibits no distension. There is no tenderness. There is no rebound and no guarding.  Genitourinary: Rectal exam shows no external hemorrhoid and anal tone normal. There is no rash, tenderness, lesion or injury on the right labia. There is no rash, tenderness, lesion or injury on the left labia. Uterus is not deviated, not enlarged, not fixed and not tender. Cervix exhibits no motion tenderness, no discharge and no friability. Right adnexum displays no mass, no tenderness and no fullness. Left adnexum displays no mass, no tenderness and no fullness. No erythema, tenderness or bleeding in the vagina. No foreign body in the vagina. No signs of injury around the vagina. No vaginal discharge found.  Neurological: She is alert and oriented to person, place, and time.  Skin: Skin is warm and dry.  Psychiatric: She has a normal mood and affect. Her behavior is normal. Thought content normal.  Vitals reviewed.         Assessment & Plan:  Diabetes- chronic problem.  Pt still did not start the Jardiance as directed.  Just on Metformin.  UTD on foot exam, eye exam, microalbumin.  Check labs.  Adjust meds prn   Birth control- since pt is in a new relationship and still having periods,  we discussed that she needs to start birth control if she doesn't want to be pregnant.  Pt states 'i don't want no kids'.  Will start OCPs after she starts her next cycle.  Pt expressed understanding and is in agreement w/ plan.   Possible exposure to STD- pt in new relationship.  Will check for STDs at pt'Reilly request  Pap- collected today.

## 2016-03-21 NOTE — Progress Notes (Signed)
Pre visit review using our clinic review tool, if applicable. No additional management support is needed unless otherwise documented below in the visit note. 

## 2016-03-22 ENCOUNTER — Other Ambulatory Visit: Payer: Self-pay | Admitting: Family Medicine

## 2016-03-22 DIAGNOSIS — IMO0001 Reserved for inherently not codable concepts without codable children: Secondary | ICD-10-CM

## 2016-03-22 DIAGNOSIS — E1165 Type 2 diabetes mellitus with hyperglycemia: Principal | ICD-10-CM

## 2016-03-22 LAB — RPR

## 2016-03-22 LAB — HIV ANTIBODY (ROUTINE TESTING W REFLEX): HIV 1&2 Ab, 4th Generation: NONREACTIVE

## 2016-03-23 LAB — CYTOLOGY - PAP
Bacterial vaginitis: POSITIVE — AB
Candida vaginitis: NEGATIVE
Chlamydia: NEGATIVE
Diagnosis: NEGATIVE
HPV: NOT DETECTED
Neisseria Gonorrhea: NEGATIVE
TRICH (WINDOWPATH): NEGATIVE

## 2016-03-24 ENCOUNTER — Other Ambulatory Visit: Payer: Self-pay | Admitting: General Practice

## 2016-03-24 MED ORDER — METRONIDAZOLE 500 MG PO TABS: 500.0000 mg | ORAL_TABLET | Freq: Two times a day (BID) | ORAL | 0 refills | Status: AC

## 2016-03-31 ENCOUNTER — Telehealth: Payer: Self-pay | Admitting: Family Medicine

## 2016-03-31 NOTE — Telephone Encounter (Signed)
Could you get more information please?

## 2016-03-31 NOTE — Telephone Encounter (Signed)
Patient states that on Sunday she was coughing and not feeling well. She started taking DayQuil  - but every day has been worse.  She has extreme fatigue, cold sweat that lasts about an hour at a time. Her body is achy, especially her legs being tender around her calves.   Any activity wears her out to the point that she feels like she needs a nap. No GI upset, no fever.... Coughing has now gone away completely, but she is still aching and very tired.

## 2016-03-31 NOTE — Telephone Encounter (Signed)
Sounds like the flu.  Would alternate tylenol/ibuprofen every 4 hrs as needed for fever/body aches.  Lots of fluid, lots of rest.  The flu this year can take quite awhile to recover from.  Hang in there!

## 2016-03-31 NOTE — Telephone Encounter (Signed)
Patient has been informed of information.  Stated verbal understanding.  Patient is aware to call if she starts getting worse or just feels like she needs to be seen.  She appreciated the call/help.

## 2016-03-31 NOTE — Telephone Encounter (Signed)
Patient wants advice regarding body aches she is having.  Thanks.  -LL

## 2016-04-05 ENCOUNTER — Ambulatory Visit
Admission: RE | Admit: 2016-04-05 | Discharge: 2016-04-05 | Disposition: A | Discharge status: Home / Self Care | Payer: BLUE CROSS/BLUE SHIELD | Source: Ambulatory Visit | Attending: Family Medicine | Admitting: Family Medicine

## 2016-04-05 DIAGNOSIS — Z1231 Encounter for screening mammogram for malignant neoplasm of breast: Secondary | ICD-10-CM

## 2016-05-02 ENCOUNTER — Ambulatory Visit: Payer: BLUE CROSS/BLUE SHIELD | Admitting: Internal Medicine

## 2016-05-02 ENCOUNTER — Telehealth: Payer: Self-pay | Admitting: Internal Medicine

## 2016-05-02 NOTE — Telephone Encounter (Signed)
Patient no showed today's appt. Please advise on how to follow up. °A. No follow up necessary. °B. Follow up urgent. Contact patient immediately. °C. Follow up necessary. Contact patient and schedule visit in ___ days. °D. Follow up advised. Contact patient and schedule visit in ____weeks. ° °

## 2016-07-25 ENCOUNTER — Other Ambulatory Visit: Payer: Self-pay | Admitting: Family Medicine

## 2016-07-25 ENCOUNTER — Encounter: Payer: Self-pay | Admitting: Emergency Medicine

## 2016-07-25 NOTE — Telephone Encounter (Signed)
My chart message sent notify patient she needs an fasting appointment for more refills

## 2016-07-31 ENCOUNTER — Encounter: Payer: Self-pay | Admitting: Endocrinology

## 2016-09-09 ENCOUNTER — Other Ambulatory Visit: Payer: Self-pay | Admitting: Family Medicine

## 2016-10-12 ENCOUNTER — Encounter: Payer: Self-pay | Admitting: *Deleted

## 2016-11-20 ENCOUNTER — Other Ambulatory Visit: Payer: Self-pay | Admitting: General Practice

## 2016-11-20 MED ORDER — METFORMIN HCL 1000 MG PO TABS: 1000.0000 mg | ORAL_TABLET | Freq: Two times a day (BID) | ORAL | 0 refills | Status: DC

## 2016-12-08 LAB — HM DIABETES EYE EXAM

## 2017-01-25 ENCOUNTER — Other Ambulatory Visit: Payer: Self-pay | Admitting: Family Medicine

## 2017-01-26 ENCOUNTER — Encounter: Payer: Self-pay | Admitting: General Practice

## 2017-01-26 NOTE — Telephone Encounter (Signed)
Please advise? Pt no showed appt with Dr. Elvera LennoxGherghe.

## 2017-02-07 ENCOUNTER — Other Ambulatory Visit: Payer: Self-pay

## 2017-02-07 ENCOUNTER — Ambulatory Visit (INDEPENDENT_AMBULATORY_CARE_PROVIDER_SITE_OTHER): Payer: BLUE CROSS/BLUE SHIELD | Admitting: Physician Assistant

## 2017-02-07 ENCOUNTER — Encounter: Payer: Self-pay | Admitting: Physician Assistant

## 2017-02-07 ENCOUNTER — Ambulatory Visit: Payer: BLUE CROSS/BLUE SHIELD | Admitting: Family Medicine

## 2017-02-07 VITALS — BP 122/81 | HR 85 | Temp 98.1°F | Resp 16 | Ht 65.0 in | Wt 263.2 lb

## 2017-02-07 DIAGNOSIS — E1165 Type 2 diabetes mellitus with hyperglycemia: Secondary | ICD-10-CM | POA: Diagnosis not present

## 2017-02-07 DIAGNOSIS — B353 Tinea pedis: Secondary | ICD-10-CM

## 2017-02-07 MED ORDER — CLOTRIMAZOLE-BETAMETHASONE 1-0.05 % EX CREA: 1.0000 "application " | TOPICAL_CREAM | Freq: Two times a day (BID) | CUTANEOUS | 0 refills | Status: DC

## 2017-02-07 NOTE — Patient Instructions (Signed)
Please go to the lab today for blood work.  I will call you with your results. We will alter treatment regimen(s) if indicated by your results.   Please use the Lamisil as directed. I will send in refills of Metformin once I review labs.  We will also likely be starting another medication in lieu of the Jardiance.  I am setting you up with Endocrinology as well.

## 2017-02-07 NOTE — Progress Notes (Signed)
History of Present Illness: Patient is a 41 y.o. female who presents to clinic today for follow-up of Diabetes Mellitus II, uncontrolled.  Patient currently on medication regimen of Metformin and Jardiance. Is not taking medications as directed. Endorses only taking Metformin 1000 mg once daily instead of BID as directed. Is not taking Jardiance at all as she states it upset her stotmach.  Denies change in vision. Denies numbness/tingling of extremities. Is not checking blood glucose as directed. Is non-adherent with follow-up recommended by her PCP.   Latest Maintenance: A1C --  Lab Results  Component Value Date   HGBA1C 10.6 (H) 02/07/2017   Diabetic Eye Exam -- Denies acute changes in vision. Eye appointment 3 months ago.  Urine Microalbumin -- Due today. Foot Exam -- Due today. Denies concerns today.  Past Medical History:  Diagnosis Date  . Diabetes mellitus without complication [facility](HCC)     Current Outpatient Medications on File Prior to Visit  Medication Sig Dispense Refill  . albuterol (PROAIR HFA) 108 (90 Base) MCG/ACT inhaler Inhale 2 puffs into the lungs every 4 (four) hours as needed for wheezing or shortness of breath. 1 Inhaler 6  . ibuprofen (ADVIL,MOTRIN) 200 MG tablet Take 200 mg by mouth every 6 (six) hours as needed.    . loratadine (CLARITIN) 10 MG tablet Take 10 mg by mouth daily.    . norethindrone-ethinyl estradiol (MICROGESTIN,JUNEL,LOESTRIN) 1-20 MG-MCG tablet Take 1 tablet by mouth daily. (Patient not taking: Reported on 02/07/2017) 1 Package 11   No current facility-administered medications on file prior to visit.     No Known Allergies  Family History  Problem Relation Age of Onset  . Lung cancer Mother   . Breast cancer Mother   . Deep vein thrombosis Mother   . Heart Problems Mother        had pacemaker  . Diabetes Mother   . Diabetes Father        controls with diet/exercise  . Cancer Maternal Uncle        esophagus  . Cancer Maternal  Grandfather        esophagus  . Diabetes Cousin   . Alzheimer's disease Maternal Aunt   . Alzheimer's disease Maternal Grandmother   . Hypertension Maternal Grandmother     Social History   Socioeconomic History  . Marital status: Married    Spouse name: None  . Number of children: None  . Years of education: None  . Highest education level: None  Social Needs  . Financial resource strain: None  . Food insecurity - worry: None  . Food insecurity - inability: None  . Transportation needs - medical: None  . Transportation needs - non-medical: None  Occupational History  . None  Tobacco Use  . Smoking status: Never Smoker  . Smokeless tobacco: Never Used  Substance and Sexual Activity  . Alcohol use: Yes    Comment: occ  . Drug use: No  . Sexual activity: Yes  Other Topics Concern  . None  Social History Narrative  . None   Review of Systems: Pertinent ROS are listed in HPI  Physical Examination: BP 122/81   Pulse 85   Temp 98.1 F (36.7 C) (Oral)   Resp 16   Ht 5\' 5"  (1.651 m)   Wt 263 lb 4 oz (119.4 kg)   SpO2 98%   BMI 43.81 kg/m  General appearance: alert, cooperative, appears stated age and no distress Head: Normocephalic, without obvious abnormality, atraumatic Lungs: clear  to auscultation bilaterally Heart: regular rate and rhythm, S1, S2 normal, no murmur, click, rub or gallop Extremities: extremities normal, atraumatic, no cyanosis or edema Pulses: 2+ and symmetric Skin: Skin color, texture, turgor normal. No rashes or lesions Lymph nodes: Cervical, supraclavicular, and axillary nodes normal. Neurologic: Alert and oriented X 3, normal strength and tone. Normal symmetric reflexes. Normal coordination and gait  Diabetic Foot Exam - Simple   Simple Foot Form Diabetic Foot exam was performed with the following findings:  Yes 02/07/2017  5:06 PM  Visual Inspection No deformities, no ulcerations, no other skin breakdown bilaterally:  Yes See comments:   Yes Sensation Testing Intact to touch and monofilament testing bilaterally:  Yes Pulse Check Posterior Tibialis and Dorsalis pulse intact bilaterally:  Yes Comments Mild maceration and dryness between toes, concerning for mild tinea pedis    Assessment/Plan: 1. Uncontrolled type 2 diabetes mellitus with hyperglycemia (HCC) Repeat labs today. Patient non-adherent with prescribed regimen. Is not checking fasting sugars as directed. Discussed with patient that her illness will only worsen and cause further health issues if she does not start to take this seriously. Reviewed dietary and exercise recommendations. She is to start checking fasting sugars daily and record. Will check labs today to assess renal function. Will consider changing current regimen to Xigduo XR so we can get her more compliant with dosing and better control of sugars. Referral to Endo placed. Declines immunizations. Eye exam up-to-date. Foot exam updated. No sign of neuropathy. Start Lotrisone for tinea pedis. Supportive measures reviewed. Follow-up to be scheduled with PCP. - Comprehensive metabolic panel - Lipid panel - Hemoglobin A1c - Urine Microalbumin w/creat. ratio - clotrimazole-betamethasone (LOTRISONE) cream; Apply 1 application topically 2 (two) times daily.  Dispense: 30 g; Refill: 0

## 2017-02-08 ENCOUNTER — Other Ambulatory Visit: Payer: Self-pay | Admitting: General Practice

## 2017-02-08 ENCOUNTER — Other Ambulatory Visit: Payer: Self-pay | Admitting: Physician Assistant

## 2017-02-08 ENCOUNTER — Telehealth: Payer: Self-pay | Admitting: Family Medicine

## 2017-02-08 LAB — COMPREHENSIVE METABOLIC PANEL
AG RATIO: 1.3 (calc) (ref 1.0–2.5)
ALBUMIN MSPROF: 3.9 g/dL (ref 3.6–5.1)
ALKALINE PHOSPHATASE (APISO): 64 U/L (ref 33–115)
ALT: 16 U/L (ref 6–29)
AST: 14 U/L (ref 10–30)
BILIRUBIN TOTAL: 0.9 mg/dL (ref 0.2–1.2)
BUN: 15 mg/dL (ref 7–25)
CO2: 26 mmol/L (ref 20–32)
CREATININE: 0.79 mg/dL (ref 0.50–1.10)
Calcium: 9.4 mg/dL (ref 8.6–10.2)
Chloride: 98 mmol/L (ref 98–110)
GLOBULIN: 3.1 g/dL (ref 1.9–3.7)
Glucose, Bld: 537 mg/dL (ref 65–99)
Potassium: 3.8 mmol/L (ref 3.5–5.3)
SODIUM: 130 mmol/L — AB (ref 135–146)
Total Protein: 7 g/dL (ref 6.1–8.1)

## 2017-02-08 LAB — LIPID PANEL
CHOL/HDL RATIO: 6.3 (calc) — AB (ref <5.0)
Cholesterol: 189 mg/dL (ref <200)
HDL: 30 mg/dL — AB (ref >50)
NON-HDL CHOLESTEROL (CALC): 159 mg/dL — AB (ref <130)
TRIGLYCERIDES: 421 mg/dL — AB (ref <150)

## 2017-02-08 LAB — HEMOGLOBIN A1C
Hgb A1c MFr Bld: 10.6 % of total Hgb — ABNORMAL HIGH (ref <5.7)
Mean Plasma Glucose: 258 (calc)
eAG (mmol/L): 14.3 (calc)

## 2017-02-08 LAB — MICROALBUMIN / CREATININE URINE RATIO
CREATININE, URINE: 37 mg/dL (ref 20–275)
Microalb Creat Ratio: 11 mcg/mg creat (ref <30)
Microalb, Ur: 0.4 mg/dL

## 2017-02-08 MED ORDER — ONETOUCH VERIO VI SOLN: 3 refills | Status: DC

## 2017-02-08 MED ORDER — ONETOUCH DELICA LANCETS 33G MISC: 12 refills | Status: DC

## 2017-02-08 MED ORDER — ONETOUCH VERIO W/DEVICE KIT: 1.0000 | PACK | Freq: Every day | 1 refills | Status: DC

## 2017-02-08 MED ORDER — GLUCOSE BLOOD VI STRP: ORAL_STRIP | 12 refills | Status: DC

## 2017-02-08 MED ORDER — DAPAGLIFLOZIN PRO-METFORMIN ER 5-1000 MG PO TB24: 1.0000 | ORAL_TABLET | Freq: Every day | ORAL | 3 refills | Status: DC

## 2017-02-08 NOTE — Telephone Encounter (Signed)
Copied from CRM [phone]. Topic: Quick Communication - See Telephone Encounter >> Feb 08, 2017  7:26 PM Trula SladeWalter, Linda F wrote: CRM for notification. See Telephone encounter for:  02/08/17. Patient wanted to inform the doctor that she cannot take Xigduo.  The pharmacist suggested taking something that is not time released.

## 2017-02-09 MED ORDER — METFORMIN HCL 1000 MG PO TABS
1000.0000 mg | ORAL_TABLET | Freq: Two times a day (BID) | ORAL | 1 refills | Status: DC
Start: 2017-02-09 — End: 2017-07-27

## 2017-02-09 MED ORDER — DAPAGLIFLOZIN PROPANEDIOL 5 MG PO TABS: 5.0000 mg | ORAL_TABLET | Freq: Every day | ORAL | 1 refills | Status: DC

## 2017-02-09 NOTE — Telephone Encounter (Signed)
LM for patient to call to discuss.  Would like to document symptoms that she has as to why she cannot take medication.

## 2017-02-09 NOTE — Telephone Encounter (Signed)
Noted. Would recommend the following:  Discontinue the Xigduo. I will change her Metformin to branded Glumetza at equivocable dose. She should be able to crush this. (we may potentially have to fill out a PA for this).  Will send in Farxiga 5 mg for her to take once daily. (voucher at front desk to make free for her for now) Keep close eye on sugars.  Follow-up with Dr. Beverely Lowabori in 2 weeks for further assessment and adjustment pending she has not seen Endo at that time.

## 2017-02-09 NOTE — Telephone Encounter (Signed)
Patient states that she cannot swallow pills and that she had been crushing her Metformin.   She cannot take this medication because it is time released and pharmacist advised that she cannot crush them. She is asking if there is something different she can take..Marland Kitchen

## 2017-02-09 NOTE — Telephone Encounter (Signed)
Patient is aware of information. Stated verbal understanding..Marland Kitchen

## 2017-02-09 NOTE — Addendum Note (Signed)
Addended by: Waldon MerlMARTIN, Ruchy Wildrick C on: 02/09/2017 09:59 AM   Modules accepted: Orders

## 2017-02-28 ENCOUNTER — Encounter: Payer: Self-pay | Admitting: Family Medicine

## 2017-02-28 ENCOUNTER — Ambulatory Visit (INDEPENDENT_AMBULATORY_CARE_PROVIDER_SITE_OTHER): Payer: BLUE CROSS/BLUE SHIELD | Admitting: Family Medicine

## 2017-02-28 ENCOUNTER — Other Ambulatory Visit: Payer: Self-pay

## 2017-02-28 VITALS — BP 124/85 | HR 90 | Temp 98.3°F | Resp 17 | Ht 65.0 in | Wt 260.5 lb

## 2017-02-28 DIAGNOSIS — E1165 Type 2 diabetes mellitus with hyperglycemia: Secondary | ICD-10-CM

## 2017-02-28 MED ORDER — DULAGLUTIDE 0.75 MG/0.5ML ~~LOC~~ SOAJ: 0.7500 mg | SUBCUTANEOUS | 3 refills | Status: DC

## 2017-02-28 NOTE — Patient Instructions (Signed)
Follow up in 1 month to recheck sugars START the Trulicity once weekly- 1 injection weekly CONTINUE the Metformin twice daily Continue to check your sugars regularly so we can see the trends and you can see what causes your sugar to go up Continue to work on healthy diet and regular exercise- you can do it! Call with any questions or concerns Happy New Year!!!

## 2017-02-28 NOTE — Assessment & Plan Note (Signed)
Deteriorated.  Pt's A1C last month was over 10.  She did not start the Xigduo as recommended by Malva Coganody Martin.  Stressed need for medication and f/u compliance.  Since she is unable to swallow pills and clearly needs better A1C control, will start Trulicity.  Medication dosage, administration, and possible side effects explained to pt.  Will follow closely.

## 2017-02-28 NOTE — Progress Notes (Signed)
   Subjective:    Patient ID: Lorna S [place], female    DOB: 01/20/1976, 42 y.o.   MRN: [id number]  HPI DM- pt was seen in December by Selena Battenody and A1C had jumped to 10.6.  Is only taking Metformin as she states she cannot swallow Xigduo.  UTD on foot exam, eye exam, microalbumin.  Pt has been checking CBGs and 'it went down to 249 on Monday and I was tickled' but by morning was back up to 350.  Pt is asking for 2 additional weeks to work on diet.  Not exercising- 'discussing it in my mind'.   Review of Systems For ROS see HPI     Objective:   Physical Exam  Constitutional: She is oriented to person, place, and time. She appears well-developed and well-nourished. No distress.  obese  HENT:  Head: Normocephalic and atraumatic.  Cardiovascular: Normal rate, regular rhythm and intact distal pulses.  Pulmonary/Chest: Effort normal and breath sounds normal. No respiratory distress. She has no wheezes. She has no rales.  Musculoskeletal: She exhibits no edema.  Neurological: She is alert and oriented to person, place, and time.  Skin: Skin is warm and dry.  Psychiatric: She has a normal mood and affect. Her behavior is normal. Thought content normal.  Vitals reviewed.         Assessment & Plan:

## 2017-03-30 ENCOUNTER — Encounter: Payer: Self-pay | Admitting: Family Medicine

## 2017-03-30 ENCOUNTER — Other Ambulatory Visit: Payer: Self-pay

## 2017-03-30 ENCOUNTER — Ambulatory Visit (INDEPENDENT_AMBULATORY_CARE_PROVIDER_SITE_OTHER): Payer: BLUE CROSS/BLUE SHIELD | Admitting: Family Medicine

## 2017-03-30 VITALS — BP 121/80 | HR 76 | Temp 98.1°F | Resp 16 | Ht 65.0 in | Wt 263.4 lb

## 2017-03-30 DIAGNOSIS — S0501XA Injury of conjunctiva and corneal abrasion without foreign body, right eye, initial encounter: Secondary | ICD-10-CM

## 2017-03-30 DIAGNOSIS — E1165 Type 2 diabetes mellitus with hyperglycemia: Secondary | ICD-10-CM

## 2017-03-30 LAB — GLUCOSE, POCT (MANUAL RESULT ENTRY): POC Glucose: 173 mg/dl — AB (ref 70–99)

## 2017-03-30 LAB — POCT GLYCOSYLATED HEMOGLOBIN (HGB A1C): Hemoglobin A1C: 8.5

## 2017-03-30 MED ORDER — POLYMYXIN B-TRIMETHOPRIM 10000-0.1 UNIT/ML-% OP SOLN: OPHTHALMIC | 0 refills | Status: DC

## 2017-03-30 MED ORDER — DULAGLUTIDE 1.5 MG/0.5ML ~~LOC~~ SOAJ: 0.5000 mL | SUBCUTANEOUS | 3 refills | Status: DC

## 2017-03-30 NOTE — Assessment & Plan Note (Signed)
Pt's CBGs are much improved since starting Trulicity last month.  A1C has dropped nearly 2 points since mid-December!  Applauded her efforts and compliance.  Will increase Trulicity to 1.5 and continue to monitor closely.  Pt expressed understanding and is in agreement w/ plan.

## 2017-03-30 NOTE — Patient Instructions (Addendum)
Follow up in 3 months to recheck diabetes Finish the Trulicity pens that you have and then increase to 1.5mg  pen when you pick up the new prescription 8.5!!!!!  That has dropped almost 2 whole points!!! Keep up the good work on healthy diet and regular exercise- you can do it! Use the eye drops as directed Call with any questions or concerns Happy Valentine's Daily!!

## 2017-03-30 NOTE — Progress Notes (Signed)
   Subjective:    Patient ID: Belinda Reilly [place], female    DOB: 06/25/1975, 42 y.o.   MRN: [id number]  HPI DM- chronic problem.  Pt was started on Trulicity at her last visit and she was to continue Metformin BID.  Pt says, 'I love it'.  CBGs were running 400-500 and are now consistently in the 200'Reilly with only 1 reading over 300.  Pt reports feeling better.  No CP, SOB.  Had mild nausea but this improved.  Taking her Trulicity Wed night 6:30pm.  CBG today 173.  R eye pain and photophobia after sleeping on wrist last night.   Review of Systems For ROS see HPI     Objective:   Physical Exam  Constitutional: She is oriented to person, place, and time. She appears well-developed and well-nourished. No distress.  obese  HENT:  Head: Normocephalic and atraumatic.  Eyes: EOM are normal. Pupils are equal, round, and reactive to light.  R conjunctival injxn w/ photophobia  Cardiovascular: Normal rate, regular rhythm and normal heart sounds.  Pulmonary/Chest: Effort normal and breath sounds normal. No respiratory distress. She has no wheezes. She has no rales.  Neurological: She is alert and oriented to person, place, and time.  Skin: Skin is warm and dry.  Psychiatric: She has a normal mood and affect. Her behavior is normal. Thought content normal.  Vitals reviewed.         Assessment & Plan:  R corneal abrasion- new.  Pt'Reilly redness and photophobia is consistent w/ a corneal abrasion.  Start topical abx.  Reviewed supportive care and red flags that should prompt return.  Pt expressed understanding and is in agreement w/ plan.

## 2017-06-25 ENCOUNTER — Other Ambulatory Visit: Payer: Self-pay

## 2017-06-25 ENCOUNTER — Ambulatory Visit (INDEPENDENT_AMBULATORY_CARE_PROVIDER_SITE_OTHER): Payer: BLUE CROSS/BLUE SHIELD | Admitting: Family Medicine

## 2017-06-25 ENCOUNTER — Encounter: Payer: Self-pay | Admitting: Family Medicine

## 2017-06-25 VITALS — BP 120/80 | HR 83 | Temp 98.3°F | Resp 16 | Ht 65.0 in | Wt 265.0 lb

## 2017-06-25 DIAGNOSIS — E1165 Type 2 diabetes mellitus with hyperglycemia: Secondary | ICD-10-CM

## 2017-06-25 NOTE — Assessment & Plan Note (Signed)
Chronic problem.  Tolerating Metformin and Trulicity.  UTD on foot exam, eye exam, microalbumin.  Stressed need for healthy diet and regular exercise.  Check labs.  Adjust meds prn.

## 2017-06-25 NOTE — Progress Notes (Signed)
   Subjective:    Patient ID: Belinda Reilly, female    DOB: 12-08-1975, 42 y.o.   MRN: [id number]  HPI DM- chronic problem, on Trulicity once weekly, Metformin  BID.  UTD on microalbumin, eye exam, foot exam.  Not checking sugars at home regularly.  Last check was 197 2 weeks ago.  Some nausea the day after taking the Trulicity.  Denies symptomatic lows.  No CP, SOB, HAs, visual changes, abd pain.  + numbness/tingling of balls of feet.  Wears steel toed shoes w/ little cushioning for work.   Review of Systems For ROS see HPI     Objective:   Physical Exam  Constitutional: She is oriented to person, place, and time. She appears well-developed and well-nourished. No distress.  obese  HENT:  Head: Normocephalic and atraumatic.  Eyes: Pupils are equal, round, and reactive to light. Conjunctivae and EOM are normal.  Neck: Normal range of motion. Neck supple. No thyromegaly present.  Cardiovascular: Normal rate, regular rhythm, normal heart sounds and intact distal pulses.  No murmur heard. Pulmonary/Chest: Effort normal and breath sounds normal. No respiratory distress.  Abdominal: Soft. She exhibits no distension. There is no tenderness.  Musculoskeletal: She exhibits no edema.  Lymphadenopathy:    She has no cervical adenopathy.  Neurological: She is alert and oriented to person, place, and time.  Skin: Skin is warm and dry.  Psychiatric: She has a normal mood and affect. Her behavior is normal.  Vitals reviewed.         Assessment & Plan:

## 2017-06-25 NOTE — Patient Instructions (Signed)
Schedule your complete physical in 3-4 months We'll notify you of your lab results and make any changes if needed Continue to work on healthy diet and regular exercise Call with any questions or concerns Happy Spring!!!

## 2017-06-25 NOTE — Assessment & Plan Note (Signed)
Ongoing issue for pt.  Stressed need for healthy diet and regular exercise.  Check labs to risk stratify.  Will follow 

## 2017-06-26 ENCOUNTER — Encounter: Payer: Self-pay | Admitting: General Practice

## 2017-06-26 LAB — CBC WITH DIFFERENTIAL/PLATELET
BASOS ABS: 55 {cells}/uL (ref 0–200)
Basophils Relative: 0.5 %
EOS ABS: 164 {cells}/uL (ref 15–500)
Eosinophils Relative: 1.5 %
HCT: 33.7 % — ABNORMAL LOW (ref 35.0–45.0)
HEMOGLOBIN: 11.4 g/dL — AB (ref 11.7–15.5)
Lymphs Abs: 4981 cells/uL — ABNORMAL HIGH (ref 850–3900)
MCH: 26.8 pg — AB (ref 27.0–33.0)
MCHC: 33.8 g/dL (ref 32.0–36.0)
MCV: 79.3 fL — ABNORMAL LOW (ref 80.0–100.0)
MONOS PCT: 5.1 %
MPV: 10.3 fL (ref 7.5–12.5)
NEUTROS PCT: 47.2 %
Neutro Abs: 5145 cells/uL (ref 1500–7800)
Platelets: 344 10*3/uL (ref 140–400)
RBC: 4.25 10*6/uL (ref 3.80–5.10)
RDW: 13.8 % (ref 11.0–15.0)
TOTAL LYMPHOCYTE: 45.7 %
WBC mixed population: 556 cells/uL (ref 200–950)
WBC: 10.9 10*3/uL — ABNORMAL HIGH (ref 3.8–10.8)

## 2017-06-26 LAB — HEMOGLOBIN A1C
HEMOGLOBIN A1C: 7.7 %{Hb} — AB (ref <5.7)
MEAN PLASMA GLUCOSE: 174 (calc)
eAG (mmol/L): 9.7 (calc)

## 2017-06-26 LAB — BASIC METABOLIC PANEL
BUN: 17 mg/dL (ref 7–25)
CHLORIDE: 100 mmol/L (ref 98–110)
CO2: 27 mmol/L (ref 20–32)
Calcium: 9.6 mg/dL (ref 8.6–10.2)
Creat: 0.83 mg/dL (ref 0.50–1.10)
Glucose, Bld: 202 mg/dL — ABNORMAL HIGH (ref 65–99)
Potassium: 4.1 mmol/L (ref 3.5–5.3)
Sodium: 138 mmol/L (ref 135–146)

## 2017-06-26 LAB — HEPATIC FUNCTION PANEL
AG Ratio: 1.4 (calc) (ref 1.0–2.5)
ALKALINE PHOSPHATASE (APISO): 52 U/L (ref 33–115)
ALT: 12 U/L (ref 6–29)
AST: 12 U/L (ref 10–30)
Albumin: 4.2 g/dL (ref 3.6–5.1)
Bilirubin, Direct: 0.1 mg/dL (ref 0.0–0.2)
Globulin: 3 g/dL (calc) (ref 1.9–3.7)
Indirect Bilirubin: 0.6 mg/dL (calc) (ref 0.2–1.2)
Total Bilirubin: 0.7 mg/dL (ref 0.2–1.2)
Total Protein: 7.2 g/dL (ref 6.1–8.1)

## 2017-06-26 LAB — LIPID PANEL
Cholesterol: 150 mg/dL (ref <200)
HDL: 37 mg/dL — AB (ref >50)
LDL Cholesterol (Calc): 75 mg/dL (calc)
NON-HDL CHOLESTEROL (CALC): 113 mg/dL (ref <130)
Total CHOL/HDL Ratio: 4.1 (calc) (ref <5.0)
Triglycerides: 288 mg/dL — ABNORMAL HIGH (ref <150)

## 2017-06-26 LAB — TSH: TSH: 2.88 m[IU]/L

## 2017-07-06 ENCOUNTER — Other Ambulatory Visit: Payer: Self-pay | Admitting: Family Medicine

## 2017-07-06 DIAGNOSIS — Z1231 Encounter for screening mammogram for malignant neoplasm of breast: Secondary | ICD-10-CM

## 2017-07-27 ENCOUNTER — Other Ambulatory Visit: Payer: Self-pay | Admitting: Physician Assistant

## 2017-07-27 ENCOUNTER — Ambulatory Visit
Admission: RE | Admit: 2017-07-27 | Discharge: 2017-07-27 | Disposition: A | Discharge status: Home / Self Care | Payer: BLUE CROSS/BLUE SHIELD | Source: Ambulatory Visit | Attending: Family Medicine | Admitting: Family Medicine

## 2017-07-27 DIAGNOSIS — Z1231 Encounter for screening mammogram for malignant neoplasm of breast: Secondary | ICD-10-CM | POA: Diagnosis not present

## 2017-08-30 ENCOUNTER — Other Ambulatory Visit: Payer: Self-pay | Admitting: Family Medicine

## 2017-09-25 ENCOUNTER — Other Ambulatory Visit: Payer: Self-pay | Admitting: Family Medicine

## 2017-09-25 DIAGNOSIS — E1165 Type 2 diabetes mellitus with hyperglycemia: Secondary | ICD-10-CM

## 2017-10-01 ENCOUNTER — Other Ambulatory Visit: Payer: Self-pay | Admitting: Family Medicine

## 2017-12-07 ENCOUNTER — Encounter: Payer: Self-pay | Admitting: Family Medicine

## 2017-12-07 ENCOUNTER — Other Ambulatory Visit: Payer: Self-pay

## 2017-12-07 ENCOUNTER — Ambulatory Visit (INDEPENDENT_AMBULATORY_CARE_PROVIDER_SITE_OTHER): Payer: BLUE CROSS/BLUE SHIELD | Admitting: Family Medicine

## 2017-12-07 VITALS — BP 122/82 | HR 78 | Temp 98.5°F | Resp 16 | Ht 65.0 in | Wt 254.2 lb

## 2017-12-07 DIAGNOSIS — Z0000 Encounter for general adult medical examination without abnormal findings: Secondary | ICD-10-CM

## 2017-12-07 DIAGNOSIS — E1165 Type 2 diabetes mellitus with hyperglycemia: Secondary | ICD-10-CM | POA: Diagnosis not present

## 2017-12-07 LAB — BASIC METABOLIC PANEL
BUN: 15 mg/dL (ref 6–23)
CO2: 28 mEq/L (ref 19–32)
Calcium: 9.7 mg/dL (ref 8.4–10.5)
Chloride: 100 mEq/L (ref 96–112)
Creatinine, Ser: 0.84 mg/dL (ref 0.40–1.20)
GFR: 95.01 mL/min (ref >60.00)
Glucose, Bld: 155 mg/dL — ABNORMAL HIGH (ref 70–99)
Potassium: 4.3 mEq/L (ref 3.5–5.1)
SODIUM: 138 meq/L (ref 135–145)

## 2017-12-07 LAB — LIPID PANEL
CHOL/HDL RATIO: 4
Cholesterol: 169 mg/dL (ref 0–200)
HDL: 37.6 mg/dL — AB (ref >39.00)
NONHDL: 131.47
Triglycerides: 237 mg/dL — ABNORMAL HIGH (ref 0.0–149.0)
VLDL: 47.4 mg/dL — ABNORMAL HIGH (ref 0.0–40.0)

## 2017-12-07 LAB — CBC WITH DIFFERENTIAL/PLATELET
BASOS PCT: 0.3 % (ref 0.0–3.0)
Basophils Absolute: 0 10*3/uL (ref 0.0–0.1)
EOS ABS: 0.1 10*3/uL (ref 0.0–0.7)
EOS PCT: 1 % (ref 0.0–5.0)
HEMATOCRIT: 37.1 % (ref 36.0–46.0)
HEMOGLOBIN: 12.4 g/dL (ref 12.0–15.0)
LYMPHS PCT: 37.8 % (ref 12.0–46.0)
Lymphs Abs: 4 10*3/uL (ref 0.7–4.0)
MCHC: 33.3 g/dL (ref 30.0–36.0)
MCV: 81 fl (ref 78.0–100.0)
MONOS PCT: 5.2 % (ref 3.0–12.0)
Monocytes Absolute: 0.6 10*3/uL (ref 0.1–1.0)
NEUTROS ABS: 5.9 10*3/uL (ref 1.4–7.7)
Neutrophils Relative %: 55.7 % (ref 43.0–77.0)
Platelets: 384 10*3/uL (ref 150.0–400.0)
RBC: 4.58 Mil/uL (ref 3.87–5.11)
RDW: 15.3 % (ref 11.5–15.5)
WBC: 10.6 10*3/uL — ABNORMAL HIGH (ref 4.0–10.5)

## 2017-12-07 LAB — HEPATIC FUNCTION PANEL
ALT: 13 U/L (ref 0–35)
AST: 13 U/L (ref 0–37)
Albumin: 4.4 g/dL (ref 3.5–5.2)
Alkaline Phosphatase: 54 U/L (ref 39–117)
BILIRUBIN DIRECT: 0.2 mg/dL (ref 0.0–0.3)
BILIRUBIN TOTAL: 1.5 mg/dL — AB (ref 0.2–1.2)
Total Protein: 7.9 g/dL (ref 6.0–8.3)

## 2017-12-07 LAB — TSH: TSH: 1.25 u[IU]/mL (ref 0.35–4.50)

## 2017-12-07 LAB — LDL CHOLESTEROL, DIRECT: Direct LDL: 113 mg/dL

## 2017-12-07 LAB — HEMOGLOBIN A1C: Hgb A1c MFr Bld: 6.8 % — ABNORMAL HIGH (ref 4.6–6.5)

## 2017-12-07 NOTE — Progress Notes (Signed)
   Subjective:    Patient ID: Belinda Reilly, female    DOB: 12-Jun-1975, 42 y.o.   MRN: [id number]  HPI CPE- UTD on mammo, pap.  Declines immunizations.  Due for eye exam- pt plans to schedule.  She is down 11 lbs from last visit!  Pt is eating better.   Review of Systems Patient reports no vision/ hearing changes, adenopathy,fever, weight change,  persistant/recurrent hoarseness , swallowing issues, chest pain, palpitations, edema, persistant/recurrent cough, hemoptysis, dyspnea (rest/exertional/paroxysmal nocturnal), gastrointestinal bleeding (melena, rectal bleeding), abdominal pain, significant heartburn, bowel changes, GU symptoms (dysuria, hematuria, incontinence), Gyn symptoms (abnormal  bleeding, pain),  syncope, focal weakness, memory loss, numbness & tingling, skin/hair/nail changes, abnormal bruising or bleeding, anxiety, or depression.     Objective:   Physical Exam General Appearance:    Alert, cooperative, no distress, appears stated age, obese  Head:    Normocephalic, without obvious abnormality, atraumatic  Eyes:    PERRL, conjunctiva/corneas clear, EOM's intact, fundi    benign, both eyes  Ears:    Normal TM's and external ear canals, both ears  Nose:   Nares normal, septum midline, mucosa normal, no drainage    or sinus tenderness  Throat:   Lips, mucosa, and tongue normal; teeth and gums normal  Neck:   Supple, symmetrical, trachea midline, no adenopathy;    Thyroid: no enlargement/tenderness/nodules  Back:     Symmetric, no curvature, ROM normal, no CVA tenderness  Lungs:     Clear to auscultation bilaterally, respirations unlabored  Chest Wall:    No tenderness or deformity   Heart:    Regular rate and rhythm, S1 and S2 normal, no murmur, rub   or gallop  Breast Exam:    Deferred to mammo  Abdomen:     Soft, non-tender, bowel sounds active all four quadrants,    no masses, no organomegaly  Genitalia:    Deferred  Rectal:    Extremities:   Extremities normal,  atraumatic, no cyanosis or edema  Pulses:   2+ and symmetric all extremities  Skin:   Skin color, texture, turgor normal, no rashes or lesions  Lymph nodes:   Cervical, supraclavicular, and axillary nodes normal  Neurologic:   CNII-XII intact, normal strength, sensation and reflexes    throughout          Assessment & Plan:

## 2017-12-07 NOTE — Assessment & Plan Note (Signed)
Pt's PE WNL w/ exception of obesity.  UTD on pap, mammo.  Declines immunizations.  Down 11 lbs!  Check labs.  Anticipatory guidance provided.

## 2017-12-07 NOTE — Assessment & Plan Note (Signed)
Chronic problem.  Pt is doing well on Metformin and Trulicity.  Down 11 lbs!  Due for eye exam- pt to schedule.  UTD on foot exam, microalbumin.  Check labs. Adjust meds prn.

## 2017-12-07 NOTE — Patient Instructions (Addendum)
Follow up in 6 months to recheck diabetes We'll notify you of your lab results and make any changes if needed Schedule your eye exam!! Keep up the good work on healthy diet and regular exercise- you're doing great! Call with any questions or concerns Happy Fall!!!

## 2017-12-10 ENCOUNTER — Other Ambulatory Visit: Payer: Self-pay | Admitting: General Practice

## 2017-12-10 DIAGNOSIS — E785 Hyperlipidemia, unspecified: Secondary | ICD-10-CM

## 2017-12-10 MED ORDER — SIMVASTATIN 20 MG PO TABS: 20.0000 mg | ORAL_TABLET | Freq: Every day | ORAL | 3 refills | Status: DC

## 2017-12-27 ENCOUNTER — Other Ambulatory Visit: Payer: Self-pay

## 2017-12-27 ENCOUNTER — Ambulatory Visit (INDEPENDENT_AMBULATORY_CARE_PROVIDER_SITE_OTHER): Payer: BLUE CROSS/BLUE SHIELD | Admitting: Family Medicine

## 2017-12-27 ENCOUNTER — Encounter: Payer: Self-pay | Admitting: Family Medicine

## 2017-12-27 VITALS — BP 128/80 | HR 83 | Temp 98.0°F | Ht 65.0 in | Wt 262.6 lb

## 2017-12-27 DIAGNOSIS — M26621 Arthralgia of right temporomandibular joint: Secondary | ICD-10-CM | POA: Diagnosis not present

## 2017-12-27 MED ORDER — CYCLOBENZAPRINE HCL 10 MG PO TABS: 10.0000 mg | ORAL_TABLET | Freq: Every day | ORAL | 0 refills | Status: DC

## 2017-12-27 NOTE — Patient Instructions (Signed)
Please follow up if symptoms do not improve or as needed.   Temporomandibular Joint Syndrome Temporomandibular joint (TMJ) syndrome is a condition that affects the joints between your jaw and your skull. The TMJs are located near your ears and allow your jaw to open and close. These joints and the nearby muscles are involved in all movements of the jaw. People with TMJ syndrome have pain in the area of these joints and muscles. Chewing, biting, or other movements of the jaw can be difficult or painful. TMJ syndrome can be caused by various things. In many cases, the condition is mild and goes away within a few weeks. For some people, the condition can become a long-term problem. What are the causes? Possible causes of TMJ syndrome include:  Grinding your teeth or clenching your jaw. Some people do this when they are under stress.  Arthritis.  Injury to the jaw.  Head or neck injury.  Teeth or dentures that are not aligned well.  In some cases, the cause of TMJ syndrome may not be known. What are the signs or symptoms? The most common symptom is an aching pain on the side of the head in the area of the TMJ. Other symptoms may include:  Pain when moving your jaw, such as when chewing or biting.  Being unable to open your jaw all the way.  Making a clicking sound when you open your mouth.  Headache.  Earache.  Neck or shoulder pain.  How is this diagnosed? Diagnosis can usually be made based on your symptoms, your medical history, and a physical exam. Your health care provider may check the range of motion of your jaw. Imaging tests, such as X-rays or an MRI, are sometimes done. You may need to see your dentist to determine if your teeth and jaw are lined up correctly. How is this treated? TMJ syndrome often goes away on its own. If treatment is needed, the options may include:  Eating soft foods and applying ice or heat.  Medicines to relieve pain or inflammation.  Medicines  to relax the muscles.  A splint, bite plate, or mouthpiece to prevent teeth grinding or jaw clenching.  Relaxation techniques or counseling to help reduce stress.  Transcutaneous electrical nerve stimulation (TENS). This helps to relieve pain by applying an electrical current through the skin.  Acupuncture. This is sometimes helpful to relieve pain.  Jaw surgery. This is rarely needed.  Follow these instructions at home:  Take medicines only as directed by your health care provider.  Eat a soft diet if you are having trouble chewing.  Apply ice to the painful area. ? Put ice in a plastic bag. ? Place a towel between your skin and the bag. ? Leave the ice on for 20 minutes, 2-3 times a day.  Apply a warm compress to the painful area as directed.  Massage your jaw area and perform any jaw stretching exercises as recommended by your health care provider.  If you were given a mouthpiece or bite plate, wear it as directed.  Avoid foods that require a lot of chewing. Do not chew gum.  Keep all follow-up visits as directed by your health care provider. This is important. Contact a health care provider if:  You are having trouble eating.  You have new or worsening symptoms. Get help right away if:  Your jaw locks open or closed. This information is not intended to replace advice given to you by your health care provider. Make  sure you discuss any questions you have with your health care provider. Document Released: 11/01/2000 Document Revised: 10/07/2015 Document Reviewed: 09/11/2013 Elsevier Interactive Patient Education  [employer].

## 2017-12-27 NOTE — Progress Notes (Signed)
Subjective  CC:  Chief Complaint  Patient presents with  . Ear Pain    Pain under  Rigth ear near jaw x 3 weeks     HPI: Belinda Reilly is a 42 y.o. female who presents to the office today to address the problems listed above in the chief complaint.  C/o 3 weeks of pain at right jaw/infront of ear: can't open mouth all the way. No trauma. No h/o bruxism. No ear pain, f/c/s, uri sxs. Mild allergy sxs. Hasn't used any meds.    Assessment  1. Arthralgia of right temporomandibular joint      Plan   TMJ syndrome:  Monitor for clenching jaw or grinding teeth at night. Start mm relaxer at night and bid nsaids otc. F/u with dentist if persists to discuss if mouth guard is warranted. See avs.   Follow up: Return if symptoms worsen or fail to improve.   No orders of the defined types were placed in this encounter.  Meds ordered this encounter  Medications  . cyclobenzaprine (FLEXERIL) 10 MG tablet    Sig: Take 1 tablet (10 mg total) by mouth at bedtime.    Dispense:  20 tablet    Refill:  0      I reviewed the patients updated PMH, FH, and SocHx.    Patient Active Problem List   Diagnosis Date Noted  . Plantar fasciitis of right foot 07/13/2015  . Physical exam 03/05/2014  . Fungal infection of foot 03/05/2014  . Perforated nasal septum 07/01/2013  . DM (diabetes mellitus), type 2, uncontrolled ([place]) 03/26/2013  . Family history of breast cancer in first degree relative 12/18/2012  . Severe obesity (BMI >= 40) ([place]) 12/18/2012  . Screening for malignant neoplasm of the cervix 12/18/2012  . Routine gynecological examination 12/18/2012   Current Meds  Medication Sig  . albuterol (PROVENTIL HFA;VENTOLIN HFA) 108 (90 Base) MCG/ACT inhaler INHALE 2 PUFFS INTO THE LUNGS EVERY 4 HOURS AS NEEDED FOR WHEEZING OR SHORTNESS OF BREATH  . Blood Glucose Calibration (ONETOUCH VERIO) SOLN Use solution to calibrate glucometer per instructions  . Blood Glucose Monitoring Suppl (ONETOUCH  VERIO) w/Device KIT 1 kit by Does not apply route daily. To check sugars twice daily  . clotrimazole-betamethasone (LOTRISONE) cream Apply 1 application topically 2 (two) times daily.  Marland Kitchen glucose blood (ONETOUCH VERIO) test strip Use one strip each time sugars are tested. Pt will check sugars twice daily.  Marland Kitchen ibuprofen (ADVIL,MOTRIN) 200 MG tablet Take 200 mg by mouth every 6 (six) hours as needed.  . loratadine (CLARITIN) 10 MG tablet Take 10 mg by mouth daily.  . metFORMIN (GLUCOPHAGE) 1000 MG tablet TAKE 1 TABLET BY MOUTH TWICE DAILY  . ONETOUCH DELICA LANCETS 12I MISC Use one lancet each time sugars are tested. Pt will check sugars twice daily.  . simvastatin (ZOCOR) 20 MG tablet Take 1 tablet (20 mg total) by mouth at bedtime.  . TRULICITY 1.5 NO/6.7EH SOPN INJECT 1.5 MG INTO THE SKIN ONCE A WEEK    Allergies: Patient has No Known Allergies. Family History: Patient family history includes Alzheimer's disease in her maternal aunt and maternal grandmother; Breast cancer (age of onset: 80) in her mother; Cancer in her maternal grandfather and maternal uncle; Deep vein thrombosis in her mother; Diabetes in her cousin, father, and mother; Heart Problems in her mother; Hypertension in her maternal grandmother; Lung cancer in her mother. Social History:  Patient  reports that she has never smoked. She has  never used smokeless tobacco. She reports that she drinks alcohol. She reports that she does not use drugs.  Review of Systems: Constitutional: Negative for fever malaise or anorexia Cardiovascular: negative for chest pain Respiratory: negative for SOB or persistent cough Gastrointestinal: negative for abdominal pain  Objective  Vitals: BP 128/80   Pulse 83   Temp 98 F (36.7 C)   Ht '5\' 5"'  (1.651 m)   Wt 262 lb 9.6 oz (119.1 kg)   SpO2 98%   BMI 43.70 kg/m  General: no acute distress , A&Ox3 HEENT: PEERL, conjunctiva normal, mild nasal congestion, TMs nl landmarks bilaterally,  Oropharynx moist,neck is supple. Left TMJ is tender and limited rom w/o dislocation or palpable subluxation, OP is clear. No LAD Cardiovascular:  RRR without murmur or gallop.  Respiratory:  Good breath sounds bilaterally, CTAB with normal respiratory effort Skin:  Warm, no rashes     Commons side effects, risks, benefits, and alternatives for medications and treatment plan prescribed today were discussed, and the patient expressed understanding of the given instructions. Patient is instructed to call or message via MyChart if he/she has any questions or concerns regarding our treatment plan. No barriers to understanding were identified. We discussed Red Flag symptoms and signs in detail. Patient expressed understanding regarding what to do in case of urgent or emergency type symptoms.   Medication list was reconciled, printed and provided to the patient in AVS. Patient instructions and summary information was reviewed with the patient as documented in the AVS. This note was prepared with assistance of Dragon voice recognition software. Occasional wrong-word or sound-a-like substitutions may have occurred due to the inherent limitations of voice recognition software

## 2018-01-01 ENCOUNTER — Telehealth: Payer: Self-pay

## 2018-01-01 ENCOUNTER — Telehealth: Payer: Self-pay | Admitting: Emergency Medicine

## 2018-01-01 MED ORDER — TRAMADOL HCL 50 MG PO TABS: 50.0000 mg | ORAL_TABLET | Freq: Three times a day (TID) | ORAL | 0 refills | Status: DC | PRN

## 2018-01-01 NOTE — Telephone Encounter (Signed)
Copied from CRM [id number]. Topic: General - Inquiry >> Jan 01, 2018  1:44 PM Terisa Starr wrote: Reason for CRM: pt state she seen Dr Mardelle Matte on 11/7 and said she was in pain around her ear and jaw. Patient was given muscle relaxer's and they are not helping. She said she is not getting much sleep. She said she can not open her mouth wide, she said when cold air hits it, it hurts really bad. She wants to know if she needs to come back in or go to the dentist? Her mouth feels really tight.   Please advise.   Kathi Simpers,  LPN

## 2018-01-01 NOTE — Telephone Encounter (Signed)
Patient informed of recommendations and advised to call back if she has additional questions, needs or concerns. Patient verbalized understanding.   Kathi SimpersAmy Caton Popowski,  LPN

## 2018-01-01 NOTE — Telephone Encounter (Signed)
I recommend seeing her dentist first; then f/u with [facility] if dentist is not helpful.  I've ordered tramadol for pain control in the meantime.

## 2018-01-01 NOTE — Addendum Note (Signed)
Addended by: Asencion PartridgeANDY, Yulonda Wheeling on: 01/01/2018 03:47 PM   Modules accepted: Orders

## 2018-01-01 NOTE — Telephone Encounter (Signed)
error 

## 2018-01-11 ENCOUNTER — Encounter: Payer: Self-pay | Admitting: Family Medicine

## 2018-02-20 ENCOUNTER — Other Ambulatory Visit: Payer: Self-pay | Admitting: Family Medicine

## 2018-02-20 DIAGNOSIS — E1165 Type 2 diabetes mellitus with hyperglycemia: Secondary | ICD-10-CM

## 2018-02-26 LAB — HM DIABETES EYE EXAM

## 2018-03-05 ENCOUNTER — Other Ambulatory Visit: Payer: Self-pay | Admitting: Family Medicine

## 2018-04-22 ENCOUNTER — Ambulatory Visit: Payer: Self-pay

## 2018-04-22 NOTE — Telephone Encounter (Signed)
Pt. Started coughing yesterday. Productive. Not sure what color mucus is. Felt like she had a fever yesterday. No runny nose currently. Chest is sore from coughing. Reviewed home remedies with pt.Instructed if symptoms worsen to call back. Verbalizes understanding.  Reason for Disposition . Cough  Answer Assessment - Initial Assessment Questions 1. ONSET: "When did the cough begin?"      Yesterday 2. SEVERITY: "How bad is the cough today?"      Moderate 3. RESPIRATORY DISTRESS: "Describe your breathing."      No distress 4. FEVER: "Do you have a fever?" If so, ask: "What is your temperature, how was it measured, and when did it start?"     Fever yesterday 5. SPUTUM: "Describe the color of your sputum" (clear, white, yellow, green)     Clear 6. HEMOPTYSIS: "Are you coughing up any blood?" If so ask: "How much?" (flecks, streaks, tablespoons, etc.)     No 7. CARDIAC HISTORY: "Do you have any history of heart disease?" (e.g., heart attack, congestive heart failure)      No 8. LUNG HISTORY: "Do you have any history of lung disease?"  (e.g., pulmonary embolus, asthma, emphysema)     No 9. PE RISK FACTORS: "Do you have a history of blood clots?" (or: recent major surgery, recent prolonged travel, bedridden)     No 10. OTHER SYMPTOMS: "Do you have any other symptoms?" (e.g., runny nose, wheezing, chest pain)       Chest sore from coughing, rattling 11. PREGNANCY: "Is there any chance you are pregnant?" "When was your last menstrual period?"       No 12. TRAVEL: "Have you traveled out of the country in the last month?" (e.g., travel history, exposures)       No  Protocols used: COUGH - ACUTE PRODUCTIVE-A-AH

## 2018-05-02 ENCOUNTER — Ambulatory Visit: Payer: Self-pay

## 2018-05-02 NOTE — Telephone Encounter (Signed)
Call returned to patient who states she was treated for a respiratory virus last week. She was directed to treat with OTC medications,no antibiotic. She states she started to have diarrhea on Thursday last week.  She reports that with every meal she would have cramping and a loose watery yellow/green stool. She reports some lightheadedness Monday. Today she has had BM that is soft brown and almost formed.  Still having cramping with each meal.  She has been hydrating with water and green tea. She is voiding normally. She has no vomiting or fever or lighheadedness.Per protocol home care advice read to patient. Pt verbalized understanding of all instructions.  Reason for Disposition . SEVERE diarrhea (e.g., 7 or more times / day more than normal)  Answer Assessment - Initial Assessment Questions 1. DIARRHEA SEVERITY: "How bad is the diarrhea?" "How many extra stools have you had in the past 24 hours than normal?"    - NO DIARRHEA (SCALE 0)   - MILD (SCALE 1-3): Few loose or mushy BMs; increase of 1-3 stools over normal daily number of stools; mild increase in ostomy output.   -  MODERATE (SCALE 4-7): Increase of 4-6 stools daily over normal; moderate increase in ostomy output. * SEVERE (SCALE 8-10; OR 'WORST POSSIBLE'): Increase of 7 or more stools daily over normal; moderate increase in ostomy output; incontinence.     4-7 2. ONSET: "When did the diarrhea begin?"      thursday 3. BM CONSISTENCY: "How loose or watery is the diarrhea?"      Today semi formed today turning brown today but has bee watery and yellow 4. VOMITING: "Are you also vomiting?" If so, ask: "How many times in the past 24 hours?"      no 5. ABDOMINAL PAIN: "Are you having any abdominal pain?" If yes: "What does it feel like?" (e.g., crampy, dull, intermittent, constant)      Intermittent cramping 6. ABDOMINAL PAIN SEVERITY: If present, ask: "How bad is the pain?"  (e.g., Scale 1-10; mild, moderate, or severe)   - MILD (1-3):  doesn't interfere with normal activities, abdomen soft and not tender to touch    - MODERATE (4-7): interferes with normal activities or awakens from sleep, tender to touch    - SEVERE (8-10): excruciating pain, doubled over, unable to do any normal activities       1 7. ORAL INTAKE: If vomiting, "Have you been able to drink liquids?" "How much fluids have you had in the past 24 hours?"     Eating and drinking 8. HYDRATION: "Any signs of dehydration?" (e.g., dry mouth [not just dry lips], too weak to stand, dizziness, new weight loss) "When did you last urinate?"    Lightheaded  ended Monday 9. EXPOSURE: "Have you traveled to a foreign country recently?" "Have you been exposed to anyone with diarrhea?" "Could you have eaten any food that was spoiled?"     no 10. ANTIBIOTIC USE: "Are you taking antibiotics now or have you taken antibiotics in the past 2 months?"       no 11. OTHER SYMPTOMS: "Do you have any other symptoms?" (e.g., fever, blood in stool)     no 12. PREGNANCY: "Is there any chance you are pregnant?" "When was your last menstrual period?"      No menses just started  Protocols used: DIARRHEA-A-AH

## 2018-05-07 ENCOUNTER — Other Ambulatory Visit: Payer: Self-pay | Admitting: Family Medicine

## 2018-05-15 ENCOUNTER — Encounter: Payer: Self-pay | Admitting: Family Medicine

## 2018-06-06 ENCOUNTER — Encounter: Payer: Self-pay | Admitting: Family Medicine

## 2018-06-06 ENCOUNTER — Ambulatory Visit (INDEPENDENT_AMBULATORY_CARE_PROVIDER_SITE_OTHER): Payer: BLUE CROSS/BLUE SHIELD | Admitting: Family Medicine

## 2018-06-06 ENCOUNTER — Other Ambulatory Visit: Payer: Self-pay

## 2018-06-06 DIAGNOSIS — E785 Hyperlipidemia, unspecified: Secondary | ICD-10-CM

## 2018-06-06 DIAGNOSIS — E1165 Type 2 diabetes mellitus with hyperglycemia: Secondary | ICD-10-CM

## 2018-06-06 DIAGNOSIS — E1169 Type 2 diabetes mellitus with other specified complication: Secondary | ICD-10-CM

## 2018-06-06 NOTE — Progress Notes (Signed)
Virtual Visit via Video   I connected with Gabriella Iran on 06/06/18 at 10:00 AM EDT by a video enabled telemedicine application and verified that I am speaking with the correct person using two identifiers. Location patient: Home Location provider: [facility], Office Persons participating in the virtual visit: pt and myself  I discussed the limitations of evaluation and management by telemedicine and the availability of in person appointments. The patient expressed understanding and agreed to proceed.  Subjective:   HPI:  DM- chronic problem, on Metformin 244WN BID and Trulicity 0.2VO weekly.  Pt does not check sugars at home.  Overdue for eye exam (was scheduled but cancelled due to COVID), microalbumin.  Pt started walking yesterday.  'I feel good'.  Denies symptomatic lows.  No numbness/tingling of hands/feet.  No CP, SOB, HAs, visual changes, abd pain, N/V, edema.  Not following a low carb diet  Hyperlipidemia- chronic problem, on Simvastatin 58m daily.  'I think i'm eating ok'.  Obesity- Last BMI 42.  Pt has no way of checking weight today.  Pt reports she is not eating right or exercising.  ROS: See pertinent positives and negatives per HPI.  Patient Active Problem List   Diagnosis Date Noted  . Plantar fasciitis of right foot 07/13/2015  . Physical exam 03/05/2014  . Fungal infection of foot 03/05/2014  . Perforated nasal septum 07/01/2013  . DM (diabetes mellitus), type 2, uncontrolled ([place] 03/26/2013  . Family history of breast cancer in first degree relative 12/18/2012  . Severe obesity (BMI >= 40) ([place] 12/18/2012  . Screening for malignant neoplasm of the cervix 12/18/2012  . Routine gynecological examination 12/18/2012    Social History   Tobacco Use  . Smoking status: Never Smoker  . Smokeless tobacco: Never Used  Substance Use Topics  . Alcohol use: Yes    Comment: occ    Current Outpatient Medications:  .  Blood Glucose Calibration (ONETOUCH  VERIO) SOLN, Use solution to calibrate glucometer per instructions, Disp: 1 each, Rfl: 3 .  Blood Glucose Monitoring Suppl (ONETOUCH VERIO) w/Device KIT, 1 kit by Does not apply route daily. To check sugars twice daily, Disp: 1 kit, Rfl: 1 .  clotrimazole-betamethasone (LOTRISONE) cream, Apply 1 application topically 2 (two) times daily., Disp: 30 g, Rfl: 0 .  cyclobenzaprine (FLEXERIL) 10 MG tablet, Take 1 tablet (10 mg total) by mouth at bedtime., Disp: 20 tablet, Rfl: 0 .  glucose blood (ONETOUCH VERIO) test strip, Use one strip each time sugars are tested. Pt will check sugars twice daily., Disp: 50 each, Rfl: 12 .  ibuprofen (ADVIL,MOTRIN) 200 MG tablet, Take 200 mg by mouth every 6 (six) hours as needed., Disp: , Rfl:  .  loratadine (CLARITIN) 10 MG tablet, Take 10 mg by mouth daily., Disp: , Rfl:  .  metFORMIN (GLUCOPHAGE) 1000 MG tablet, TAKE 1 TABLET BY MOUTH TWICE DAILY, Disp: 180 tablet, Rfl: 0 .  ONETOUCH DELICA LANCETS 353GMISC, Use one lancet each time sugars are tested. Pt will check sugars twice daily., Disp: 100 each, Rfl: 12 .  PROAIR HFA 108 (90 Base) MCG/ACT inhaler, INHALE 2 PUFFS INTO THE LUNGS EVERY 4 HOURS AS NEEDED FOR WHEEZING OR SHORTNESS OF BREATH, Disp: 8.5 g, Rfl: 0 .  simvastatin (ZOCOR) 20 MG tablet, Take 1 tablet (20 mg total) by mouth at bedtime., Disp: 30 tablet, Rfl: 3 .  traMADol (ULTRAM) 50 MG tablet, Take 1 tablet (50 mg total) by mouth every 8 (eight) hours as needed.,  Disp: 15 tablet, Rfl: 0 .  TRULICITY 1.5 VO/5.9YT SOPN, INJECT 1.5 MG INTO THE SKIN ONCE A WEEK, Disp: 2 mL, Rfl: 3  No Known Allergies  Objective:   There were no vitals taken for this visit. AAOx3, NAD NCAT, EOMI No obvious CN deficits Coloring WNL Pt is able to speak clearly, coherently without shortness of breath or increased work of breathing.  Thought process is linear.  Mood is appropriate.   Assessment and Plan:   DM- chronic problem.  Tolerating Metformin and Trulicity w/o  difficulty.  Due for eye exam (this was cancelled due to COVID) and microalbumin.  Started exercise yesterday.  Admits to not following a particular diet but thinks she's eating 'well'.  Hyperlipidemia- chronic problem.  Tolerating statin.  Stressed need for healthy diet and regular exercise.  Check labs.  Adjust meds prn   Obesity- BMI >40.  Stressed need for healthy diet and regular exercise.  Pt states that she plans to start exercising now that weather is better.  Encouraged her to do so.  Will follow.  Annye Asa, MD 06/06/2018

## 2018-06-07 ENCOUNTER — Other Ambulatory Visit (INDEPENDENT_AMBULATORY_CARE_PROVIDER_SITE_OTHER): Payer: BLUE CROSS/BLUE SHIELD

## 2018-06-07 ENCOUNTER — Ambulatory Visit: Payer: BLUE CROSS/BLUE SHIELD | Admitting: Family Medicine

## 2018-06-07 DIAGNOSIS — E785 Hyperlipidemia, unspecified: Secondary | ICD-10-CM

## 2018-06-07 DIAGNOSIS — E1169 Type 2 diabetes mellitus with other specified complication: Secondary | ICD-10-CM | POA: Diagnosis not present

## 2018-06-07 DIAGNOSIS — E1165 Type 2 diabetes mellitus with hyperglycemia: Secondary | ICD-10-CM | POA: Diagnosis not present

## 2018-06-07 LAB — CBC WITH DIFFERENTIAL/PLATELET
Basophils Absolute: 0 10*3/uL (ref 0.0–0.1)
Basophils Relative: 0.4 % (ref 0.0–3.0)
Eosinophils Absolute: 0.1 10*3/uL (ref 0.0–0.7)
Eosinophils Relative: 1.2 % (ref 0.0–5.0)
HCT: 36.3 % (ref 36.0–46.0)
Hemoglobin: 12.2 g/dL (ref 12.0–15.0)
Lymphocytes Relative: 37.5 % (ref 12.0–46.0)
Lymphs Abs: 3.6 10*3/uL (ref 0.7–4.0)
MCHC: 33.6 g/dL (ref 30.0–36.0)
MCV: 81 fl (ref 78.0–100.0)
Monocytes Absolute: 0.4 10*3/uL (ref 0.1–1.0)
Monocytes Relative: 4.4 % (ref 3.0–12.0)
Neutro Abs: 5.4 10*3/uL (ref 1.4–7.7)
Neutrophils Relative %: 56.5 % (ref 43.0–77.0)
Platelets: 311 10*3/uL (ref 150.0–400.0)
RBC: 4.48 Mil/uL (ref 3.87–5.11)
RDW: 15 % (ref 11.5–15.5)
WBC: 9.5 10*3/uL (ref 4.0–10.5)

## 2018-06-07 LAB — LIPID PANEL
Cholesterol: 177 mg/dL (ref 0–200)
HDL: 39.6 mg/dL (ref >39.00)
LDL Cholesterol: 97 mg/dL (ref 0–99)
NonHDL: 137.19
Total CHOL/HDL Ratio: 4
Triglycerides: 200 mg/dL — ABNORMAL HIGH (ref 0.0–149.0)
VLDL: 40 mg/dL (ref 0.0–40.0)

## 2018-06-07 LAB — HEPATIC FUNCTION PANEL
ALT: 12 U/L (ref 0–35)
AST: 11 U/L (ref 0–37)
Albumin: 3.8 g/dL (ref 3.5–5.2)
Alkaline Phosphatase: 56 U/L (ref 39–117)
Bilirubin, Direct: 0.1 mg/dL (ref 0.0–0.3)
Total Bilirubin: 0.7 mg/dL (ref 0.2–1.2)
Total Protein: 7 g/dL (ref 6.0–8.3)

## 2018-06-07 LAB — BASIC METABOLIC PANEL
BUN: 10 mg/dL (ref 6–23)
CO2: 27 mEq/L (ref 19–32)
Calcium: 8.9 mg/dL (ref 8.4–10.5)
Chloride: 100 mEq/L (ref 96–112)
Creatinine, Ser: 0.72 mg/dL (ref 0.40–1.20)
GFR: 106.55 mL/min (ref >60.00)
Glucose, Bld: 212 mg/dL — ABNORMAL HIGH (ref 70–99)
Potassium: 4.2 mEq/L (ref 3.5–5.1)
Sodium: 135 mEq/L (ref 135–145)

## 2018-06-07 LAB — MICROALBUMIN / CREATININE URINE RATIO
Creatinine,U: 85.2 mg/dL
Microalb Creat Ratio: 1 mg/g (ref 0.0–30.0)
Microalb, Ur: 0.9 mg/dL (ref 0.0–1.9)

## 2018-06-07 LAB — TSH: TSH: 2.18 u[IU]/mL (ref 0.35–4.50)

## 2018-06-07 LAB — HEMOGLOBIN A1C: Hgb A1c MFr Bld: 7.3 % — ABNORMAL HIGH (ref 4.6–6.5)

## 2018-07-03 ENCOUNTER — Other Ambulatory Visit: Payer: Self-pay | Admitting: Family Medicine

## 2018-07-03 DIAGNOSIS — E1165 Type 2 diabetes mellitus with hyperglycemia: Secondary | ICD-10-CM

## 2018-08-15 ENCOUNTER — Other Ambulatory Visit: Payer: Self-pay | Admitting: Family Medicine

## 2018-08-15 DIAGNOSIS — Z1231 Encounter for screening mammogram for malignant neoplasm of breast: Secondary | ICD-10-CM

## 2018-08-20 ENCOUNTER — Other Ambulatory Visit: Payer: Self-pay

## 2018-08-20 ENCOUNTER — Ambulatory Visit
Admission: RE | Admit: 2018-08-20 | Discharge: 2018-08-20 | Disposition: A | Discharge status: Home / Self Care | Payer: BLUE CROSS/BLUE SHIELD | Source: Ambulatory Visit | Attending: Family Medicine | Admitting: Family Medicine

## 2018-08-20 DIAGNOSIS — Z1231 Encounter for screening mammogram for malignant neoplasm of breast: Secondary | ICD-10-CM

## 2018-08-24 ENCOUNTER — Other Ambulatory Visit: Payer: Self-pay | Admitting: Family Medicine

## 2018-11-11 ENCOUNTER — Other Ambulatory Visit: Payer: Self-pay | Admitting: *Deleted

## 2018-11-11 DIAGNOSIS — Z20822 Contact with and (suspected) exposure to covid-19: Secondary | ICD-10-CM

## 2018-11-11 DIAGNOSIS — R6889 Other general symptoms and signs: Secondary | ICD-10-CM | POA: Diagnosis not present

## 2018-11-12 LAB — NOVEL CORONAVIRUS, NAA: SARS-CoV-2, NAA: NOT DETECTED

## 2018-12-07 ENCOUNTER — Encounter: Payer: Self-pay | Admitting: Family Medicine

## 2018-12-13 ENCOUNTER — Other Ambulatory Visit: Payer: Self-pay | Admitting: General Practice

## 2018-12-13 DIAGNOSIS — E1165 Type 2 diabetes mellitus with hyperglycemia: Secondary | ICD-10-CM

## 2018-12-13 MED ORDER — TRULICITY 1.5 MG/0.5ML ~~LOC~~ SOAJ: SUBCUTANEOUS | 3 refills | Status: DC

## 2019-01-31 ENCOUNTER — Encounter: Payer: BC Managed Care – PPO | Admitting: Family Medicine

## 2019-02-18 ENCOUNTER — Encounter: Payer: Self-pay | Admitting: Family Medicine

## 2019-02-18 ENCOUNTER — Ambulatory Visit (INDEPENDENT_AMBULATORY_CARE_PROVIDER_SITE_OTHER): Payer: BC Managed Care – PPO | Admitting: Family Medicine

## 2019-02-18 ENCOUNTER — Other Ambulatory Visit: Payer: Self-pay

## 2019-02-18 DIAGNOSIS — E1165 Type 2 diabetes mellitus with hyperglycemia: Secondary | ICD-10-CM

## 2019-02-18 DIAGNOSIS — Z0000 Encounter for general adult medical examination without abnormal findings: Secondary | ICD-10-CM

## 2019-02-18 NOTE — Progress Notes (Signed)
I have discussed the procedure for the virtual visit with the patient who has given consent to proceed with assessment and treatment.   Pt unable to obtain vitals, has not check BS in awhile.   Davis Gourd, CMA

## 2019-02-18 NOTE — Progress Notes (Signed)
Virtual Visit via Video   I connected with patient on 02/18/19 at  9:00 AM EST by a video enabled telemedicine application and verified that I am speaking with the correct person using two identifiers.  Location patient: Home Location provider: [facility], Office Persons participating in the virtual visit: Patient, Provider, [place] (Jess B)  I discussed the limitations of evaluation and management by telemedicine and the availability of in person appointments. The patient expressed understanding and agreed to proceed.  Subjective:   HPI:   CPE- pt is UTD on pap, mammo.  Due for Tdap.  Has eye exam scheduled for Jan 7th.  No concerns today  ROS:  Patient reports no vision/ hearing changes, adenopathy,fever, weight change,  persistant/recurrent hoarseness , swallowing issues, chest pain, palpitations, edema, persistant/recurrent cough, hemoptysis, dyspnea (rest/exertional/paroxysmal nocturnal), gastrointestinal bleeding (melena, rectal bleeding), abdominal pain, significant heartburn, bowel changes, GU symptoms (dysuria, hematuria, incontinence), Gyn symptoms (abnormal  bleeding, pain),  syncope, focal weakness, memory loss, numbness & tingling, skin/hair/nail changes, abnormal bruising or bleeding, anxiety, or depression.   Patient Active Problem List   Diagnosis Date Noted  . Hyperlipidemia associated with type 2 diabetes mellitus ([place]) 06/06/2018  . Plantar fasciitis of right foot 07/13/2015  . Physical exam 03/05/2014  . Fungal infection of foot 03/05/2014  . Perforated nasal septum 07/01/2013  . DM (diabetes mellitus), type 2, uncontrolled ([place]) 03/26/2013  . Family history of breast cancer in first degree relative 12/18/2012  . Severe obesity (BMI >= 40) ([place]) 12/18/2012  . Screening for malignant neoplasm of the cervix 12/18/2012  . Routine gynecological examination 12/18/2012    Social History   Tobacco Use  . Smoking status: Never Smoker  . Smokeless tobacco:  Never Used  Substance Use Topics  . Alcohol use: Yes    Comment: occ    Current Outpatient Medications:  .  Blood Glucose Calibration (ONETOUCH VERIO) SOLN, Use solution to calibrate glucometer per instructions, Disp: 1 each, Rfl: 3 .  Blood Glucose Monitoring Suppl (ONETOUCH VERIO) w/Device KIT, 1 kit by Does not apply route daily. To check sugars twice daily, Disp: 1 kit, Rfl: 1 .  clotrimazole-betamethasone (LOTRISONE) cream, Apply 1 application topically 2 (two) times daily., Disp: 30 g, Rfl: 0 .  Dulaglutide (TRULICITY) 1.5 IR/6.7EL SOPN, ADMINISTER 1.5 MG UNDER THE SKIN 1 TIME A WEEK, Disp: 2 mL, Rfl: 3 .  glucose blood (ONETOUCH VERIO) test strip, Use one strip each time sugars are tested. Pt will check sugars twice daily., Disp: 50 each, Rfl: 12 .  ibuprofen (ADVIL,MOTRIN) 200 MG tablet, Take 200 mg by mouth every 6 (six) hours as needed., Disp: , Rfl:  .  loratadine (CLARITIN) 10 MG tablet, Take 10 mg by mouth daily., Disp: , Rfl:  .  metFORMIN (GLUCOPHAGE) 1000 MG tablet, TAKE 1 TABLET BY MOUTH TWICE DAILY, Disp: 180 tablet, Rfl: 0 .  ONETOUCH DELICA LANCETS 38B MISC, Use one lancet each time sugars are tested. Pt will check sugars twice daily., Disp: 100 each, Rfl: 12 .  PROAIR HFA 108 (90 Base) MCG/ACT inhaler, INHALE 2 PUFFS INTO THE LUNGS EVERY 4 HOURS AS NEEDED FOR WHEEZING OR SHORTNESS OF BREATH, Disp: 8.5 g, Rfl: 0  No Known Allergies  Objective:   There were no vitals taken for this visit. AAOx3, NAD NCAT, EOMI No obvious CN deficits Coloring WNL Pt is able to speak clearly, coherently without shortness of breath or increased work of breathing.  Thought process is linear.  Mood is appropriate.  Assessment and Plan:   PE- pt's limited video PE WNL.  UTD on pap, mammo.  Will get Tdap when she comes for labs.  Encouraged healthy diet and regular exercise.  Check labs to risk stratify.  Will follow.  DM- has eye exam scheduled, UTD on microalbumin.  Check labs.   Adjust meds prn    Annye Asa, MD 02/18/2019

## 2019-02-26 ENCOUNTER — Ambulatory Visit (INDEPENDENT_AMBULATORY_CARE_PROVIDER_SITE_OTHER): Payer: BC Managed Care – PPO

## 2019-02-26 ENCOUNTER — Other Ambulatory Visit: Payer: Self-pay

## 2019-02-26 DIAGNOSIS — Z23 Encounter for immunization: Secondary | ICD-10-CM | POA: Diagnosis not present

## 2019-02-26 DIAGNOSIS — E1165 Type 2 diabetes mellitus with hyperglycemia: Secondary | ICD-10-CM | POA: Diagnosis not present

## 2019-02-26 LAB — CBC WITH DIFFERENTIAL/PLATELET
Basophils Absolute: 0.1 10*3/uL (ref 0.0–0.1)
Basophils Relative: 1.3 % (ref 0.0–3.0)
Eosinophils Absolute: 0.1 10*3/uL (ref 0.0–0.7)
Eosinophils Relative: 1.3 % (ref 0.0–5.0)
HCT: 36.7 % (ref 36.0–46.0)
Hemoglobin: 12 g/dL (ref 12.0–15.0)
Lymphocytes Relative: 31 % (ref 12.0–46.0)
Lymphs Abs: 3.5 10*3/uL (ref 0.7–4.0)
MCHC: 32.6 g/dL (ref 30.0–36.0)
MCV: 82.3 fl (ref 78.0–100.0)
Monocytes Absolute: 0.5 10*3/uL (ref 0.1–1.0)
Monocytes Relative: 4.5 % (ref 3.0–12.0)
Neutro Abs: 7.1 10*3/uL (ref 1.4–7.7)
Neutrophils Relative %: 61.9 % (ref 43.0–77.0)
Platelets: 330 10*3/uL (ref 150.0–400.0)
RBC: 4.46 Mil/uL (ref 3.87–5.11)
RDW: 14.9 % (ref 11.5–15.5)
WBC: 11.4 10*3/uL — ABNORMAL HIGH (ref 4.0–10.5)

## 2019-02-26 LAB — BASIC METABOLIC PANEL
BUN: 14 mg/dL (ref 6–23)
CO2: 27 mEq/L (ref 19–32)
Calcium: 9.2 mg/dL (ref 8.4–10.5)
Chloride: 97 mEq/L (ref 96–112)
Creatinine, Ser: 0.73 mg/dL (ref 0.40–1.20)
GFR: 104.52 mL/min (ref >60.00)
Glucose, Bld: 256 mg/dL — ABNORMAL HIGH (ref 70–99)
Potassium: 4.1 mEq/L (ref 3.5–5.1)
Sodium: 131 mEq/L — ABNORMAL LOW (ref 135–145)

## 2019-02-26 LAB — LDL CHOLESTEROL, DIRECT: Direct LDL: 108 mg/dL

## 2019-02-26 LAB — HEPATIC FUNCTION PANEL
ALT: 10 U/L (ref 0–35)
AST: 12 U/L (ref 0–37)
Albumin: 4 g/dL (ref 3.5–5.2)
Alkaline Phosphatase: 61 U/L (ref 39–117)
Bilirubin, Direct: 0.1 mg/dL (ref 0.0–0.3)
Total Bilirubin: 0.8 mg/dL (ref 0.2–1.2)
Total Protein: 7.2 g/dL (ref 6.0–8.3)

## 2019-02-26 LAB — LIPID PANEL
Cholesterol: 176 mg/dL (ref 0–200)
HDL: 41 mg/dL (ref >39.00)
NonHDL: 135.17
Total CHOL/HDL Ratio: 4
Triglycerides: 288 mg/dL — ABNORMAL HIGH (ref 0.0–149.0)
VLDL: 57.6 mg/dL — ABNORMAL HIGH (ref 0.0–40.0)

## 2019-02-26 LAB — TSH: TSH: 1.72 u[IU]/mL (ref 0.35–4.50)

## 2019-02-26 LAB — HEMOGLOBIN A1C: Hgb A1c MFr Bld: 8.2 % — ABNORMAL HIGH (ref 4.6–6.5)

## 2019-02-26 NOTE — Progress Notes (Signed)
Belinda Reilly 44 y.o. female presents to office today for blood work and to update her Tdap per Neena Rhymes, MD. Administered BOOSTRIX 0.5 mL IM right arm. Patient tolerated well.

## 2019-02-27 ENCOUNTER — Encounter: Payer: Self-pay | Admitting: Family Medicine

## 2019-02-28 ENCOUNTER — Encounter: Payer: Self-pay | Admitting: General Practice

## 2019-02-28 ENCOUNTER — Encounter: Payer: Self-pay | Admitting: Family Medicine

## 2019-02-28 ENCOUNTER — Other Ambulatory Visit: Payer: Self-pay | Admitting: General Practice

## 2019-02-28 DIAGNOSIS — E1165 Type 2 diabetes mellitus with hyperglycemia: Secondary | ICD-10-CM

## 2019-02-28 DIAGNOSIS — E785 Hyperlipidemia, unspecified: Secondary | ICD-10-CM

## 2019-02-28 MED ORDER — ATORVASTATIN CALCIUM 10 MG PO TABS: 10.0000 mg | ORAL_TABLET | Freq: Every day | ORAL | 1 refills | Status: DC

## 2019-02-28 MED ORDER — METFORMIN HCL 1000 MG PO TABS: 1000.0000 mg | ORAL_TABLET | Freq: Two times a day (BID) | ORAL | 1 refills | Status: DC

## 2019-02-28 MED ORDER — TRULICITY 1.5 MG/0.5ML ~~LOC~~ SOAJ: SUBCUTANEOUS | 1 refills | Status: DC

## 2019-04-19 ENCOUNTER — Encounter: Payer: Self-pay | Admitting: Family Medicine

## 2019-04-25 ENCOUNTER — Other Ambulatory Visit: Payer: Self-pay

## 2019-04-25 ENCOUNTER — Ambulatory Visit (INDEPENDENT_AMBULATORY_CARE_PROVIDER_SITE_OTHER): Payer: BC Managed Care – PPO

## 2019-04-25 DIAGNOSIS — E785 Hyperlipidemia, unspecified: Secondary | ICD-10-CM

## 2019-04-25 NOTE — Addendum Note (Signed)
Addended by: Lana Fish on: 04/25/2019 02:29 PM   Modules accepted: Orders

## 2019-04-26 LAB — HEPATIC FUNCTION PANEL
AG Ratio: 1.2 (calc) (ref 1.0–2.5)
ALT: 8 U/L (ref 6–29)
AST: 11 U/L (ref 10–30)
Albumin: 3.9 g/dL (ref 3.6–5.1)
Alkaline phosphatase (APISO): 56 U/L (ref 31–125)
Bilirubin, Direct: 0.2 mg/dL (ref 0.0–0.2)
Globulin: 3.3 g/dL (calc) (ref 1.9–3.7)
Indirect Bilirubin: 0.9 mg/dL (calc) (ref 0.2–1.2)
Total Bilirubin: 1.1 mg/dL (ref 0.2–1.2)
Total Protein: 7.2 g/dL (ref 6.1–8.1)

## 2019-04-28 ENCOUNTER — Encounter: Payer: Self-pay | Admitting: General Practice

## 2019-05-19 ENCOUNTER — Encounter: Payer: Self-pay | Admitting: Family Medicine

## 2019-06-19 ENCOUNTER — Ambulatory Visit (INDEPENDENT_AMBULATORY_CARE_PROVIDER_SITE_OTHER): Payer: BC Managed Care – PPO | Admitting: Family Medicine

## 2019-06-19 ENCOUNTER — Other Ambulatory Visit: Payer: Self-pay

## 2019-06-19 ENCOUNTER — Encounter: Payer: Self-pay | Admitting: Family Medicine

## 2019-06-19 VITALS — BP 118/81 | HR 91 | Temp 98.0°F | Resp 16 | Ht 65.0 in | Wt 259.0 lb

## 2019-06-19 DIAGNOSIS — E1165 Type 2 diabetes mellitus with hyperglycemia: Secondary | ICD-10-CM

## 2019-06-19 NOTE — Patient Instructions (Addendum)
Follow up in 3-4 months to recheck diabetes, cholesterol, and weight loss progress We'll notify you of your lab results and make any changes if needed Continue to work on healthy diet and regular exercise- you can do it! Call with any questions or concerns [place]!

## 2019-06-19 NOTE — Progress Notes (Signed)
   Subjective:    Patient ID: Belinda Reilly, female    DOB: [birth year], 44 y.o.   MRN: [id number]  HPI DM- chronic problem, on Trulicity 1.5mg  weekly, Metformin 1000mg  BID.  Due for foot exam, microalbumin.  UTD on eye exam- 02/27/19  Last A1C 8.2  No CP, SOB, HAs, visual changes, abd pain, N/V.  Denies symptomatic lows.  No numbness/tingling of hands/feet.   Review of Systems For ROS see HPI   This visit occurred during the SARS-CoV-2 public health emergency.  Safety protocols were in place, including screening questions prior to the visit, additional usage of staff PPE, and extensive cleaning of exam room while observing appropriate contact time as indicated for disinfecting solutions.       Objective:   Physical Exam Vitals reviewed.  Constitutional:      General: She is not in acute distress.    Appearance: She is well-developed.  HENT:     Head: Normocephalic and atraumatic.  Eyes:     Conjunctiva/sclera: Conjunctivae normal.     Pupils: Pupils are equal, round, and reactive to light.  Neck:     Thyroid: No thyromegaly.  Cardiovascular:     Rate and Rhythm: Normal rate and regular rhythm.     Heart sounds: Normal heart sounds. No murmur.  Pulmonary:     Effort: Pulmonary effort is normal. No respiratory distress.     Breath sounds: Normal breath sounds.  Abdominal:     General: There is no distension.     Palpations: Abdomen is soft.     Tenderness: There is no abdominal tenderness.  Musculoskeletal:     Cervical back: Normal range of motion and neck supple.  Lymphadenopathy:     Cervical: No cervical adenopathy.  Skin:    General: Skin is warm and dry.  Neurological:     Mental Status: She is alert and oriented to person, place, and time.  Psychiatric:        Behavior: Behavior normal.           Assessment & Plan:

## 2019-06-19 NOTE — Assessment & Plan Note (Signed)
Chronic problem.  UTD on eye exam.  Foot exam done today.  Stressed need for healthy diet and regular exercise.  Check labs.  Adjust meds prn

## 2019-06-20 LAB — BASIC METABOLIC PANEL
BUN: 24 mg/dL — ABNORMAL HIGH (ref 6–23)
CO2: 25 mEq/L (ref 19–32)
Calcium: 9.4 mg/dL (ref 8.4–10.5)
Chloride: 98 mEq/L (ref 96–112)
Creatinine, Ser: 1.03 mg/dL (ref 0.40–1.20)
GFR: 70.15 mL/min (ref >60.00)
Glucose, Bld: 239 mg/dL — ABNORMAL HIGH (ref 70–99)
Potassium: 4.1 mEq/L (ref 3.5–5.1)
Sodium: 133 mEq/L — ABNORMAL LOW (ref 135–145)

## 2019-06-20 LAB — MICROALBUMIN / CREATININE URINE RATIO
Creatinine,U: 239.6 mg/dL
Microalb Creat Ratio: 0.9 mg/g (ref 0.0–30.0)
Microalb, Ur: 2.1 mg/dL — ABNORMAL HIGH (ref 0.0–1.9)

## 2019-06-20 LAB — HEMOGLOBIN A1C: Hgb A1c MFr Bld: 8.5 % — ABNORMAL HIGH (ref 4.6–6.5)

## 2019-10-19 ENCOUNTER — Other Ambulatory Visit: Payer: Self-pay | Admitting: Family Medicine

## 2019-11-06 ENCOUNTER — Other Ambulatory Visit: Payer: Self-pay | Admitting: Family Medicine

## 2019-11-06 DIAGNOSIS — E1165 Type 2 diabetes mellitus with hyperglycemia: Secondary | ICD-10-CM

## 2020-01-06 ENCOUNTER — Encounter: Payer: Self-pay | Admitting: Family Medicine

## 2020-01-10 ENCOUNTER — Encounter: Payer: Self-pay | Admitting: Family Medicine

## 2020-01-22 ENCOUNTER — Encounter: Payer: BC Managed Care – PPO | Admitting: Family Medicine

## 2020-02-09 ENCOUNTER — Other Ambulatory Visit: Payer: BC Managed Care – PPO

## 2020-02-09 ENCOUNTER — Other Ambulatory Visit: Payer: Self-pay

## 2020-02-09 ENCOUNTER — Ambulatory Visit (INDEPENDENT_AMBULATORY_CARE_PROVIDER_SITE_OTHER): Payer: BC Managed Care – PPO | Admitting: Family Medicine

## 2020-02-09 ENCOUNTER — Encounter: Payer: Self-pay | Admitting: Family Medicine

## 2020-02-09 VITALS — BP 128/80 | HR 86 | Temp 98.4°F | Resp 17 | Ht 65.0 in | Wt 263.4 lb

## 2020-02-09 DIAGNOSIS — Z1159 Encounter for screening for other viral diseases: Secondary | ICD-10-CM | POA: Diagnosis not present

## 2020-02-09 DIAGNOSIS — Z1211 Encounter for screening for malignant neoplasm of colon: Secondary | ICD-10-CM

## 2020-02-09 DIAGNOSIS — E1165 Type 2 diabetes mellitus with hyperglycemia: Secondary | ICD-10-CM | POA: Diagnosis not present

## 2020-02-09 DIAGNOSIS — Z0000 Encounter for general adult medical examination without abnormal findings: Secondary | ICD-10-CM

## 2020-02-09 NOTE — Assessment & Plan Note (Signed)
Deteriorated.  Pt has gained another 4 lbs since last visit.  Stressed need for healthy diet and regular exercise.  Will continue to follow.

## 2020-02-09 NOTE — Progress Notes (Signed)
   Subjective:    Patient ID: Belinda Reilly, female    DOB: 1975-11-13, 44 y.o.   MRN: [id number]  HPI CPE- UTD on foot exam, eye exam, and microalbumin.  Declines flu.  Due for mammo, pap.  Due to start colon cancer screen.  Reviewed past medical, surgical, family and social histories.   Health Maintenance  Topic Date Due  . Hepatitis C Screening  Never done  . HEMOGLOBIN A1C  12/19/2019  . PAP SMEAR-Modifier  02/24/2020 (Originally 03/22/2019)  . INFLUENZA VACCINE  05/20/2020 (Originally 09/21/2019)  . PNEUMOCOCCAL POLYSACCHARIDE VACCINE AGE 26-64 HIGH RISK  06/18/2020 (Originally 07/29/1976)  . OPHTHALMOLOGY EXAM  02/27/2020  . FOOT EXAM  06/18/2020  . URINE MICROALBUMIN  06/18/2020  . TETANUS/TDAP  02/25/2029  . COVID-19 Vaccine  Completed  . HIV Screening  Completed      Review of Systems Patient reports no vision/ hearing changes, adenopathy,fever, weight change,  persistant/recurrent hoarseness , swallowing issues, chest pain, palpitations, edema, persistant/recurrent cough, hemoptysis, dyspnea (rest/exertional/paroxysmal nocturnal), gastrointestinal bleeding (melena, rectal bleeding), abdominal pain, significant heartburn, bowel changes, GU symptoms (dysuria, hematuria, incontinence), Gyn symptoms (abnormal  bleeding, pain),  syncope, focal weakness, memory loss, numbness & tingling, skin/hair/nail changes, abnormal bruising or bleeding, anxiety, or depression.   This visit occurred during the SARS-CoV-2 public health emergency.  Safety protocols were in place, including screening questions prior to the visit, additional usage of staff PPE, and extensive cleaning of exam room while observing appropriate contact time as indicated for disinfecting solutions.       Objective:   Physical Exam General Appearance:    Alert, cooperative, no distress, appears stated age, obese  Head:    Normocephalic, without obvious abnormality, atraumatic  Eyes:    PERRL, conjunctiva/corneas clear,  EOM's intact, fundi    benign, both eyes  Ears:    Normal TM's and external ear canals, both ears  Nose:   Deferred due to COVID  Throat:   Neck:   Supple, symmetrical, trachea midline, no adenopathy;    Thyroid: no enlargement/tenderness/nodules  Back:     Symmetric, no curvature, ROM normal, no CVA tenderness  Lungs:     Clear to auscultation bilaterally, respirations unlabored  Chest Wall:    No tenderness or deformity   Heart:    Regular rate and rhythm, S1 and S2 normal, no murmur, rub   or gallop  Breast Exam:    Deferred to mammo  Abdomen:     Soft, non-tender, bowel sounds active all four quadrants,    no masses, no organomegaly  Genitalia:    Deferred until next appt  Rectal:    Extremities:   Extremities normal, atraumatic, no cyanosis or edema  Pulses:   2+ and symmetric all extremities  Skin:   Skin color, texture, turgor normal, no rashes or lesions  Lymph nodes:   Cervical, supraclavicular, and axillary nodes normal  Neurologic:   CNII-XII intact, normal strength, sensation and reflexes    throughout          Assessment & Plan:

## 2020-02-09 NOTE — Assessment & Plan Note (Signed)
Pt's PE WNL w/ exception of obesity.  Due for mammo- pt to schedule.  Will do pap at next visit.  Refer for colon cancer screen.  UTD on COVID.  Declines flu.  Check labs.  Anticipatory guidance provided.

## 2020-02-09 NOTE — Assessment & Plan Note (Signed)
Chronic problem.  UTD on foot exam, eye exam, microalbumin.  Encouraged healthy diet and regular exercise.  Check labs.  Adjust meds prn

## 2020-02-09 NOTE — Patient Instructions (Signed)
Follow up in 3-4 months to recheck sugars and do a pap Please schedule a lab visit at your convenience (location of your choice) or go to [address] for labs (no appt needed)  We'll notify you of your lab results and make any changes if needed Continue to work on healthy diet and regular exercise- you can do it! We'll call you with your GI appt to discuss colonoscopy Schedule your mammogram!!!! Your eye exam is due in January- please schedule! Call with any questions or concerns Stay Safe!  Stay Healthy! Merry Christmas! HAVE FUN!!!!

## 2020-02-10 DIAGNOSIS — Z20822 Contact with and (suspected) exposure to covid-19: Secondary | ICD-10-CM | POA: Diagnosis not present

## 2020-02-23 ENCOUNTER — Other Ambulatory Visit: Payer: BC Managed Care – PPO

## 2020-02-23 ENCOUNTER — Ambulatory Visit: Payer: BC Managed Care – PPO

## 2020-02-26 ENCOUNTER — Encounter: Payer: Self-pay | Admitting: Gastroenterology

## 2020-03-04 ENCOUNTER — Other Ambulatory Visit: Payer: Self-pay

## 2020-03-04 ENCOUNTER — Ambulatory Visit (AMBULATORY_SURGERY_CENTER): Payer: BC Managed Care – PPO

## 2020-03-04 VITALS — Ht 65.5 in | Wt 260.0 lb

## 2020-03-04 DIAGNOSIS — Z1211 Encounter for screening for malignant neoplasm of colon: Secondary | ICD-10-CM

## 2020-03-04 MED ORDER — PLENVU 140 G PO SOLR: 1.0000 | ORAL | 0 refills | Status: DC

## 2020-03-04 NOTE — Progress Notes (Signed)
Pre visit completed via phone-patient verified name, DOB, No egg or soy allergy known to patient  No issues with past sedation with any surgeries or procedures No intubation problems in the past  No FH of Malignant Hyperthermia No diet pills per patient No home 02 use per patient  No blood thinners per patient  Pt denies issues with constipation  No A fib or A flutter  EMMI video via MyChart  COVID 19 guidelines implemented in [facility] today with Pt and RN  Pt is fully vaccinated  for Covid + booster Coupon given to pt in [facility] today , Code to Pharmacy  Due to the COVID-19 pandemic we are asking patients to follow certain guidelines.  Pt aware of COVID protocols and [facility] guidelines

## 2020-03-15 ENCOUNTER — Encounter: Payer: Self-pay | Admitting: Gastroenterology

## 2020-03-17 ENCOUNTER — Other Ambulatory Visit: Payer: Self-pay

## 2020-03-17 ENCOUNTER — Ambulatory Visit (AMBULATORY_SURGERY_CENTER): Payer: BC Managed Care – PPO | Admitting: Gastroenterology

## 2020-03-17 ENCOUNTER — Encounter: Payer: Self-pay | Admitting: Gastroenterology

## 2020-03-17 VITALS — BP 132/79 | HR 84 | Temp 98.7°F | Resp 13 | Ht 65.0 in | Wt 260.0 lb

## 2020-03-17 DIAGNOSIS — Z1211 Encounter for screening for malignant neoplasm of colon: Secondary | ICD-10-CM

## 2020-03-17 MED ORDER — SODIUM CHLORIDE 0.9 % IV SOLN: 500.0000 mL | INTRAVENOUS | Status: DC

## 2020-03-17 NOTE — Patient Instructions (Signed)

## 2020-03-17 NOTE — Progress Notes (Signed)
PT taken to PACU. Monitors in place. VSS. Report given to RN. 

## 2020-03-17 NOTE — Progress Notes (Signed)
Pt's states no medical or surgical changes since previsit or office visit. 

## 2020-03-17 NOTE — Op Note (Signed)
[facility] Patient Name: Belinda Reilly Procedure Date: 03/17/2020 9:05 AM MRN: [id number] Endoscopist: Viviann Spare P. Adela Lank , MD Age: 45 Referring MD:  Date of Birth: May 24, 1975 Gender: Female Account #: [id number] Procedure:                Colonoscopy Indications:              Screening for colorectal malignant neoplasm, This                            is the patient's first colonoscopy Medicines:                Monitored Anesthesia Care Procedure:                Pre-Anesthesia Assessment:                           - Prior to the procedure, a History and Physical                            was performed, and patient medications and                            allergies were reviewed. The patient's tolerance of                            previous anesthesia was also reviewed. The risks                            and benefits of the procedure and the sedation                            options and risks were discussed with the patient.                            All questions were answered, and informed consent                            was obtained. Prior Anticoagulants: The patient has                            taken no previous anticoagulant or antiplatelet                            agents. ASA Grade Assessment: III - A patient with                            severe systemic disease. After reviewing the risks                            and benefits, the patient was deemed in                            satisfactory condition to undergo the procedure.  After obtaining informed consent, the colonoscope                            was passed under direct vision. Throughout the                            procedure, the patient's blood pressure, pulse, and                            oxygen saturations were monitored continuously. The                            Olympus CF-HQ190L (Serial# [id number]) Colonoscope was                            introduced through  the anus and advanced to the the                            cecum, identified by appendiceal orifice and                            ileocecal valve. The colonoscopy was performed                            without difficulty. The patient tolerated the                            procedure well. The quality of the bowel                            preparation was good. The ileocecal valve,                            appendiceal orifice, and rectum were photographed. Scope In: 9:12:37 AM Scope Out: 9:35:31 AM Scope Withdrawal Time: 0 hours 19 minutes 33 seconds  Total Procedure Duration: 0 hours 22 minutes 54 seconds  Findings:                 The perianal and digital rectal examinations were                            normal.                           A few small-mouthed diverticula were found in the                            transverse colon.                           Internal hemorrhoids were found during retroflexion.                           The exam was otherwise without abnormality. No  polyps. Complications:            No immediate complications. Estimated blood loss:                            None. Estimated Blood Loss:     Estimated blood loss: none. Impression:               - Diverticulosis in the transverse colon.                           - Internal hemorrhoids.                           - The examination was otherwise normal.                           - No polyps Recommendation:           - Patient has a contact number available for                            emergencies. The signs and symptoms of potential                            delayed complications were discussed with the                            patient. Return to normal activities tomorrow.                            Written discharge instructions were provided to the                            patient.                           - Resume previous diet.                           -  Continue present medications.                           - Repeat colonoscopy in 10 years for screening                            purposes. Viviann Spare P. Aamori Mcmasters, MD 03/17/2020 9:38:50 AM This report has been signed electronically.

## 2020-03-19 ENCOUNTER — Telehealth: Payer: Self-pay | Admitting: *Deleted

## 2020-03-19 ENCOUNTER — Telehealth: Payer: Self-pay

## 2020-03-19 NOTE — Telephone Encounter (Signed)
Left message on f/u call 

## 2020-03-19 NOTE — Telephone Encounter (Signed)
  Follow up Call-  Call back number 03/17/2020  Post procedure Call Back phone  # [phone]  Permission to leave phone message Yes  Some recent data might be hidden     2nd follow up call made.  NALM

## 2020-06-01 ENCOUNTER — Other Ambulatory Visit: Payer: Self-pay | Admitting: Family Medicine

## 2020-06-01 DIAGNOSIS — Z1231 Encounter for screening mammogram for malignant neoplasm of breast: Secondary | ICD-10-CM

## 2020-06-09 ENCOUNTER — Encounter: Payer: BC Managed Care – PPO | Admitting: Family Medicine

## 2020-07-21 ENCOUNTER — Ambulatory Visit (INDEPENDENT_AMBULATORY_CARE_PROVIDER_SITE_OTHER): Payer: BC Managed Care – PPO | Admitting: Family Medicine

## 2020-07-21 ENCOUNTER — Encounter: Payer: Self-pay | Admitting: Family Medicine

## 2020-07-21 ENCOUNTER — Other Ambulatory Visit (HOSPITAL_COMMUNITY)
Admission: RE | Admit: 2020-07-21 | Discharge: 2020-07-21 | Disposition: A | Discharge status: Home / Self Care | Payer: BC Managed Care – PPO | Source: Ambulatory Visit | Attending: Family Medicine | Admitting: Family Medicine

## 2020-07-21 ENCOUNTER — Other Ambulatory Visit: Payer: Self-pay

## 2020-07-21 VITALS — BP 124/78 | HR 84 | Temp 98.1°F | Resp 18 | Ht 65.0 in | Wt 263.8 lb

## 2020-07-21 DIAGNOSIS — Z124 Encounter for screening for malignant neoplasm of cervix: Secondary | ICD-10-CM | POA: Diagnosis not present

## 2020-07-21 DIAGNOSIS — E1165 Type 2 diabetes mellitus with hyperglycemia: Secondary | ICD-10-CM | POA: Diagnosis not present

## 2020-07-21 LAB — HEMOGLOBIN A1C: Hgb A1c MFr Bld: 9.7 % — ABNORMAL HIGH (ref 4.6–6.5)

## 2020-07-21 NOTE — Assessment & Plan Note (Signed)
Deteriorated.  Pt has gained 4 lbs since last visit.  Again discussed need for healthy diet, regular exercise- especially in the setting of her diabetes.  Will follow.

## 2020-07-21 NOTE — Assessment & Plan Note (Signed)
Last A1C 8.5%  Pt not checking sugars.  Has eye exam scheduled.  Foot exam done today.  Will get microalbumin today.  Tolerating medication w/o difficulty.  Stressed need for healthy diet and regular exercise.  Check labs.  Adjust meds prn

## 2020-07-21 NOTE — Progress Notes (Signed)
   Subjective:    Patient ID: Ardene S [place], female    DOB: [birth year], 45 y.o.   MRN: [id number]  HPI DM- chronic problem, on Trulicity 1.5mg  weekly, Metformin 1000mg  BID.  Due for foot exam, eye exam, and microalbumin.  Pt has gained 4 lbs since last visit.  Not checking sugars.  Had to cancel eye exam last month but is rescheduled for July.  Denies symptomatic lows.  No numbness/tingling of hands/feet.  No CP, SOB, HAs, visual changes, abd pain, N/V.  Pap- due for repeat pap today.  No concerns.  No d/c, abnormal bleeding.   Review of Systems For ROS see HPI   This visit occurred during the SARS-CoV-2 public health emergency.  Safety protocols were in place, including screening questions prior to the visit, additional usage of staff PPE, and extensive cleaning of exam room while observing appropriate contact time as indicated for disinfecting solutions.       Objective:   Physical Exam  General Appearance:    Alert, cooperative, no distress, appears stated age, obese  Head:    Normocephalic, without obvious abnormality, atraumatic  Eyes:    PERRL, conjunctiva/corneas clear, EOM's intact, fundi    benign, both eyes           Neck:   Supple, symmetrical, trachea midline, no adenopathy;    Thyroid: no enlargement/tenderness/nodules  Back:     Symmetric, no curvature, ROM normal, no CVA tenderness  Lungs:     Clear to auscultation bilaterally, respirations unlabored  Chest Wall:    No tenderness or deformity   Heart:    Regular rate and rhythm, S1 and S2 normal, no murmur, rub   or gallop  Breast Exam:    Deferred to mammo  Abdomen:     Soft, non-tender, bowel sounds active all four quadrants,    no masses, no organomegaly  Genitalia:    External genitalia normal, cervix normal in appearance, no CMT, uterus in normal size and position, adnexa w/out mass or tenderness, mucosa pink and moist, no lesions or discharge present  Rectal:    Normal external appearance  Extremities:    Extremities normal, atraumatic, no cyanosis or edema  Pulses:   2+ and symmetric all extremities  Skin:   Skin color, texture, turgor normal, no rashes or lesions  Lymph nodes:   Cervical, supraclavicular, and axillary nodes normal  Neurologic:   CNII-XII intact, normal strength, sensation and reflexes    throughout          Assessment & Plan:

## 2020-07-21 NOTE — Assessment & Plan Note (Signed)
No concerns today.  Pap collected.

## 2020-07-21 NOTE — Patient Instructions (Signed)
Follow up in 3-4 months to recheck sugar and cholesterol We'll notify you of your lab results and make any changes if needed Continue to work on healthy diet and regular exercise- you can do it!!! Call with any questions or concerns Have a great summer!!

## 2020-07-23 ENCOUNTER — Ambulatory Visit
Admission: RE | Admit: 2020-07-23 | Discharge: 2020-07-23 | Disposition: A | Discharge status: Home / Self Care | Payer: BC Managed Care – PPO | Source: Ambulatory Visit | Attending: Family Medicine | Admitting: Family Medicine

## 2020-07-23 ENCOUNTER — Other Ambulatory Visit: Payer: Self-pay

## 2020-07-23 DIAGNOSIS — Z1231 Encounter for screening mammogram for malignant neoplasm of breast: Secondary | ICD-10-CM

## 2020-07-23 LAB — BASIC METABOLIC PANEL
BUN: 15 mg/dL (ref 6–23)
CO2: 20 mEq/L (ref 19–32)
Calcium: 9.3 mg/dL (ref 8.4–10.5)
Chloride: 97 mEq/L (ref 96–112)
Creatinine, Ser: 0.81 mg/dL (ref 0.40–1.20)
GFR: 87.32 mL/min (ref >60.00)
Glucose, Bld: 359 mg/dL — ABNORMAL HIGH (ref 70–99)
Potassium: 4.1 mEq/L (ref 3.5–5.1)
Sodium: 135 mEq/L (ref 135–145)

## 2020-07-23 LAB — CYTOLOGY - PAP
Comment: NEGATIVE
Diagnosis: NEGATIVE
Diagnosis: REACTIVE
High risk HPV: NEGATIVE

## 2020-07-23 LAB — MICROALBUMIN / CREATININE URINE RATIO
Creatinine,U: 70.3 mg/dL
Microalb Creat Ratio: 1.6 mg/g (ref 0.0–30.0)
Microalb, Ur: 1.1 mg/dL (ref 0.0–1.9)

## 2020-07-23 MED ORDER — FLUCONAZOLE 150 MG PO TABS: 150.0000 mg | ORAL_TABLET | Freq: Once | ORAL | 0 refills | Status: AC

## 2020-08-13 DIAGNOSIS — E113293 Type 2 diabetes mellitus with mild nonproliferative diabetic retinopathy without macular edema, bilateral: Secondary | ICD-10-CM | POA: Diagnosis not present

## 2020-08-13 DIAGNOSIS — Z135 Encounter for screening for eye and ear disorders: Secondary | ICD-10-CM | POA: Diagnosis not present

## 2020-08-14 ENCOUNTER — Encounter: Payer: Self-pay | Admitting: Family Medicine

## 2020-08-18 ENCOUNTER — Encounter: Payer: Self-pay | Admitting: *Deleted

## 2020-09-16 ENCOUNTER — Encounter: Payer: Self-pay | Admitting: Family Medicine

## 2020-09-17 ENCOUNTER — Other Ambulatory Visit: Payer: Self-pay

## 2020-09-17 MED ORDER — METFORMIN HCL 1000 MG PO TABS: ORAL_TABLET | ORAL | 1 refills | Status: DC

## 2020-10-21 ENCOUNTER — Ambulatory Visit: Payer: BC Managed Care – PPO | Admitting: Family Medicine

## 2020-12-14 ENCOUNTER — Other Ambulatory Visit: Payer: Self-pay

## 2020-12-14 ENCOUNTER — Encounter: Payer: Self-pay | Admitting: Family Medicine

## 2020-12-14 ENCOUNTER — Ambulatory Visit (INDEPENDENT_AMBULATORY_CARE_PROVIDER_SITE_OTHER): Payer: BC Managed Care – PPO | Admitting: Family Medicine

## 2020-12-14 VITALS — BP 136/80 | HR 97 | Temp 97.8°F | Resp 16 | Wt 264.2 lb

## 2020-12-14 DIAGNOSIS — E119 Type 2 diabetes mellitus without complications: Secondary | ICD-10-CM

## 2020-12-14 DIAGNOSIS — N939 Abnormal uterine and vaginal bleeding, unspecified: Secondary | ICD-10-CM

## 2020-12-14 DIAGNOSIS — E785 Hyperlipidemia, unspecified: Secondary | ICD-10-CM | POA: Diagnosis not present

## 2020-12-14 DIAGNOSIS — E1169 Type 2 diabetes mellitus with other specified complication: Secondary | ICD-10-CM

## 2020-12-14 LAB — LIPID PANEL
Cholesterol: 187 mg/dL (ref 0–200)
HDL: 39.9 mg/dL (ref >39.00)
NonHDL: 147.15
Total CHOL/HDL Ratio: 5
Triglycerides: 265 mg/dL — ABNORMAL HIGH (ref 0.0–149.0)
VLDL: 53 mg/dL — ABNORMAL HIGH (ref 0.0–40.0)

## 2020-12-14 LAB — CBC WITH DIFFERENTIAL/PLATELET
Basophils Absolute: 0 10*3/uL (ref 0.0–0.1)
Basophils Relative: 0.5 % (ref 0.0–3.0)
Eosinophils Absolute: 0.2 10*3/uL (ref 0.0–0.7)
Eosinophils Relative: 1.7 % (ref 0.0–5.0)
HCT: 36.3 % (ref 36.0–46.0)
Hemoglobin: 12 g/dL (ref 12.0–15.0)
Lymphocytes Relative: 32.4 % (ref 12.0–46.0)
Lymphs Abs: 3 10*3/uL (ref 0.7–4.0)
MCHC: 33 g/dL (ref 30.0–36.0)
MCV: 80.5 fl (ref 78.0–100.0)
Monocytes Absolute: 0.5 10*3/uL (ref 0.1–1.0)
Monocytes Relative: 5.1 % (ref 3.0–12.0)
Neutro Abs: 5.5 10*3/uL (ref 1.4–7.7)
Neutrophils Relative %: 60.3 % (ref 43.0–77.0)
Platelets: 294 10*3/uL (ref 150.0–400.0)
RBC: 4.51 Mil/uL (ref 3.87–5.11)
RDW: 15.8 % — ABNORMAL HIGH (ref 11.5–15.5)
WBC: 9.1 10*3/uL (ref 4.0–10.5)

## 2020-12-14 LAB — LDL CHOLESTEROL, DIRECT: Direct LDL: 118 mg/dL

## 2020-12-14 LAB — HEPATIC FUNCTION PANEL
ALT: 12 U/L (ref 0–35)
AST: 13 U/L (ref 0–37)
Albumin: 3.9 g/dL (ref 3.5–5.2)
Alkaline Phosphatase: 62 U/L (ref 39–117)
Bilirubin, Direct: 0.1 mg/dL (ref 0.0–0.3)
Total Bilirubin: 1 mg/dL (ref 0.2–1.2)
Total Protein: 7.2 g/dL (ref 6.0–8.3)

## 2020-12-14 LAB — BASIC METABOLIC PANEL
BUN: 14 mg/dL (ref 6–23)
CO2: 25 mEq/L (ref 19–32)
Calcium: 9.2 mg/dL (ref 8.4–10.5)
Chloride: 98 mEq/L (ref 96–112)
Creatinine, Ser: 0.73 mg/dL (ref 0.40–1.20)
GFR: 98.65 mL/min (ref >60.00)
Glucose, Bld: 229 mg/dL — ABNORMAL HIGH (ref 70–99)
Potassium: 4 mEq/L (ref 3.5–5.1)
Sodium: 132 mEq/L — ABNORMAL LOW (ref 135–145)

## 2020-12-14 LAB — HCG, QUANTITATIVE, PREGNANCY: Quantitative HCG: <0.6 m[IU]/mL

## 2020-12-14 LAB — HEMOGLOBIN A1C: Hgb A1c MFr Bld: 8.3 % — ABNORMAL HIGH (ref 4.6–6.5)

## 2020-12-14 LAB — TSH: TSH: 2.05 u[IU]/mL (ref 0.35–5.50)

## 2020-12-14 MED ORDER — NORETHINDRONE ACET-ETHINYL EST 1.5-30 MG-MCG PO TABS: 1.0000 | ORAL_TABLET | Freq: Every day | ORAL | 11 refills | Status: DC

## 2020-12-14 NOTE — Assessment & Plan Note (Signed)
Chronic problem.  Pt's LDL goal w/ diabetes is <70.  She has been attempting to get there w/ diet and exercise.  Check labs and start meds prn.

## 2020-12-14 NOTE — Assessment & Plan Note (Signed)
Chronic problem.  Last A1C was terrible but pt was not taking her medication regularly.  She has been taking all meds as directed since that visit and feels like things are 'back on track'.  UTD on foot exam, eye exam, microalbumin.  Check labs.  Adjust meds prn

## 2020-12-14 NOTE — Patient Instructions (Addendum)
Schedule your complete physical in 3-4 months We'll notify you of your lab results and make any changes if needed Continue to work on healthy diet and regular exercise- you can do it!! Start the pills on Sunday (assuming the test is negative) Have them send me a copy of your eye exam when you go Call with any questions or concerns Stay Safe!  Stay Healthy! Happy Fall!!

## 2020-12-14 NOTE — Progress Notes (Signed)
   Subjective:    Patient ID: Belinda Reilly, female    DOB: 1975/04/21, 45 y.o.   MRN: [id number]  HPI DM- chronic problem, on Metformin 1000mg  BID and Trulicity 1.5mg  weekly.  UTD on foot exam, eye exam, microalbumin.  Pt reports she is back on track w/ her medication.  No CP, SOB, HAs, visual changes, edema.  Denies symptomatic lows.  Denies numbness and tingling of hands/feet  Hyperlipidemia- attempting to control w/ diet and exercise.  Last LDL 97.  Goal is <70.  No abd pain, N/V.  Vaginal d/c- pt reports brown vaginal d/c daily.  Sxs started 10/6.  She was supposed to start her cycle that day but her usual cycle never came and instead has had 3 weeks of d/c.  Only noticeable when wiping- not in panties or urine.  Had unprotected sex on 9/24.  No concerns for STIs.     Review of Systems For ROS see HPI   This visit occurred during the SARS-CoV-2 public health emergency.  Safety protocols were in place, including screening questions prior to the visit, additional usage of staff PPE, and extensive cleaning of exam room while observing appropriate contact time as indicated for disinfecting solutions.      Objective:   Physical Exam Vitals reviewed.  Constitutional:      General: She is not in acute distress.    Appearance: Normal appearance. She is well-developed. She is obese.  HENT:     Head: Normocephalic and atraumatic.  Eyes:     Conjunctiva/sclera: Conjunctivae normal.     Pupils: Pupils are equal, round, and reactive to light.  Neck:     Thyroid: No thyromegaly.  Cardiovascular:     Rate and Rhythm: Normal rate and regular rhythm.     Pulses: Normal pulses.     Heart sounds: Normal heart sounds. No murmur heard. Pulmonary:     Effort: Pulmonary effort is normal. No respiratory distress.     Breath sounds: Normal breath sounds.  Abdominal:     General: There is no distension.     Palpations: Abdomen is soft.     Tenderness: There is no abdominal tenderness.   Genitourinary:    Comments: Deferred at pt request Musculoskeletal:     Cervical back: Normal range of motion and neck supple.     Right lower leg: No edema.     Left lower leg: No edema.  Lymphadenopathy:     Cervical: No cervical adenopathy.  Skin:    General: Skin is warm and dry.  Neurological:     Mental Status: She is alert and oriented to person, place, and time.  Psychiatric:        Mood and Affect: Mood normal.        Behavior: Behavior normal.          Assessment & Plan:  Vaginal spotting- new.  Sxs started when she was supposed to get her last period.  Her cycles have always been regular but this is new for pt.  Did have unprotected sex so there is a chance of pregnancy.  Check HCG.  She is not concerned for STIs.  If pregnancy test is negative she would like to start OCPs to regulate her cycles.  Prescription sent along w/ instructions for use.

## 2020-12-15 ENCOUNTER — Other Ambulatory Visit: Payer: Self-pay

## 2020-12-15 DIAGNOSIS — E785 Hyperlipidemia, unspecified: Secondary | ICD-10-CM

## 2020-12-15 MED ORDER — ROSUVASTATIN CALCIUM 10 MG PO TABS: 10.0000 mg | ORAL_TABLET | Freq: Every day | ORAL | 3 refills | Status: DC

## 2021-02-28 ENCOUNTER — Other Ambulatory Visit: Payer: Self-pay | Admitting: Family Medicine

## 2021-02-28 DIAGNOSIS — E1165 Type 2 diabetes mellitus with hyperglycemia: Secondary | ICD-10-CM

## 2021-03-16 ENCOUNTER — Other Ambulatory Visit: Payer: Self-pay

## 2021-03-16 ENCOUNTER — Ambulatory Visit (INDEPENDENT_AMBULATORY_CARE_PROVIDER_SITE_OTHER): Payer: BC Managed Care – PPO | Admitting: Family Medicine

## 2021-03-16 ENCOUNTER — Encounter: Payer: Self-pay | Admitting: Family Medicine

## 2021-03-16 VITALS — BP 126/72 | HR 90 | Temp 97.7°F | Resp 18 | Ht 65.5 in | Wt 253.0 lb

## 2021-03-16 DIAGNOSIS — Z0000 Encounter for general adult medical examination without abnormal findings: Secondary | ICD-10-CM

## 2021-03-16 DIAGNOSIS — E119 Type 2 diabetes mellitus without complications: Secondary | ICD-10-CM | POA: Diagnosis not present

## 2021-03-16 NOTE — Assessment & Plan Note (Signed)
Pt is down 11 lbs since last visit.  BMI now 41.46  Encouraged healthy diet and regular exercise.  Will follow.

## 2021-03-16 NOTE — Assessment & Plan Note (Signed)
Pt's PE WNL w/ exception of obesity.  UTD on pap, mammo, colonoscopy, Tdap.  Check labs.  Anticipatory guidance provided.  

## 2021-03-16 NOTE — Progress Notes (Signed)
° °  Subjective:    Patient ID: Belinda Reilly, female    DOB: 08/30/1975, 46 y.o.   MRN: [id number]  HPI CPE- UTD on pap, mammo, colonoscopy, Tdap, foot exam, microalbumin.  Eye exam done 6/24  Health Maintenance  Topic Date Due   Hepatitis C Screening  Never done   OPHTHALMOLOGY EXAM  02/27/2020   COVID-19 Vaccine (4 - Booster for Pfizer series) 03/05/2020   INFLUENZA VACCINE  05/20/2021 (Originally 09/20/2020)   HEMOGLOBIN A1C  06/14/2021   FOOT EXAM  07/21/2021   URINE MICROALBUMIN  07/21/2021   PAP SMEAR-Modifier  07/22/2023   TETANUS/TDAP  02/25/2029   COLONOSCOPY (Pts 45-22yrs Insurance coverage will need to be confirmed)  03/17/2030   HIV Screening  Completed   HPV VACCINES  Aged Out      Review of Systems Patient reports no vision/ hearing changes, adenopathy,fever, persistant/recurrent hoarseness , swallowing issues, chest pain, palpitations, edema, persistant/recurrent cough, hemoptysis, dyspnea (rest/exertional/paroxysmal nocturnal), gastrointestinal bleeding (melena, rectal bleeding), abdominal pain, significant heartburn, bowel changes, GU symptoms (dysuria, hematuria, incontinence), Gyn symptoms (abnormal  bleeding, pain),  syncope, focal weakness, memory loss, numbness & tingling, skin/hair/nail changes, abnormal bruising or bleeding, anxiety, or depression.   + 11 lb weight loss  This visit occurred during the SARS-CoV-2 public health emergency.  Safety protocols were in place, including screening questions prior to the visit, additional usage of staff PPE, and extensive cleaning of exam room while observing appropriate contact time as indicated for disinfecting solutions.      Objective:   Physical Exam General Appearance:    Alert, cooperative, no distress, appears stated age, obese  Head:    Normocephalic, without obvious abnormality, atraumatic  Eyes:    PERRL, conjunctiva/corneas clear, EOM's intact, fundi    benign, both eyes  Ears:    Normal TM's and external  ear canals, both ears  Nose:   Deferred due to COVID  Throat:   Neck:   Supple, symmetrical, trachea midline, no adenopathy;    Thyroid: no enlargement/tenderness/nodules  Back:     Symmetric, no curvature, ROM normal, no CVA tenderness  Lungs:     Clear to auscultation bilaterally, respirations unlabored  Chest Wall:    No tenderness or deformity   Heart:    Regular rate and rhythm, S1 and S2 normal, no murmur, rub   or gallop  Breast Exam:    Deferred to mammo  Abdomen:     Soft, non-tender, bowel sounds active all four quadrants,    no masses, no organomegaly  Genitalia:    Deferred   Rectal:    Extremities:   Extremities normal, atraumatic, no cyanosis or edema  Pulses:   2+ and symmetric all extremities  Skin:   Skin color, texture, turgor normal, no rashes or lesions  Lymph nodes:   Cervical, supraclavicular, and axillary nodes normal  Neurologic:   CNII-XII intact, normal strength, sensation and reflexes    throughout          Assessment & Plan:

## 2021-03-16 NOTE — Assessment & Plan Note (Signed)
Chronic problem.  Pt is down 11 lbs which means either the Trulicity is working or her A1C is out of control.  Check labs.  Adjust meds prn.  UTD on eye exam, foot exam, microalbumin.  Will follow.

## 2021-03-16 NOTE — Patient Instructions (Signed)
Follow up in 3-4 months to recheck sugars °We'll notify you of your lab results and make any changes if needed °Continue to work on healthy diet and regular exercise- you can do it! °Call with any questions or concerns °Stay Safe!  Stay Healthy! °Happy New Year!!! °

## 2021-03-17 ENCOUNTER — Other Ambulatory Visit: Payer: BC Managed Care – PPO

## 2021-03-21 ENCOUNTER — Other Ambulatory Visit (INDEPENDENT_AMBULATORY_CARE_PROVIDER_SITE_OTHER): Payer: BC Managed Care – PPO

## 2021-03-21 DIAGNOSIS — E119 Type 2 diabetes mellitus without complications: Secondary | ICD-10-CM

## 2021-03-22 LAB — CBC WITH DIFFERENTIAL/PLATELET
Basophils Absolute: 0.1 10*3/uL (ref 0.0–0.1)
Basophils Relative: 1.2 % (ref 0.0–3.0)
Eosinophils Absolute: 0.2 10*3/uL (ref 0.0–0.7)
Eosinophils Relative: 1.8 % (ref 0.0–5.0)
HCT: 33.4 % — ABNORMAL LOW (ref 36.0–46.0)
Hemoglobin: 11 g/dL — ABNORMAL LOW (ref 12.0–15.0)
Lymphocytes Relative: 32.6 % (ref 12.0–46.0)
Lymphs Abs: 3.2 10*3/uL (ref 0.7–4.0)
MCHC: 32.8 g/dL (ref 30.0–36.0)
MCV: 78.8 fl (ref 78.0–100.0)
Monocytes Absolute: 0.5 10*3/uL (ref 0.1–1.0)
Monocytes Relative: 4.9 % (ref 3.0–12.0)
Neutro Abs: 5.9 10*3/uL (ref 1.4–7.7)
Neutrophils Relative %: 59.5 % (ref 43.0–77.0)
Platelets: 272 10*3/uL (ref 150.0–400.0)
RBC: 4.24 Mil/uL (ref 3.87–5.11)
RDW: 15.2 % (ref 11.5–15.5)
WBC: 9.9 10*3/uL (ref 4.0–10.5)

## 2021-03-22 LAB — LIPID PANEL
Cholesterol: 129 mg/dL (ref 0–200)
HDL: 43.7 mg/dL (ref >39.00)
NonHDL: 85.44
Total CHOL/HDL Ratio: 3
Triglycerides: 313 mg/dL — ABNORMAL HIGH (ref 0.0–149.0)
VLDL: 62.6 mg/dL — ABNORMAL HIGH (ref 0.0–40.0)

## 2021-03-22 LAB — VITAMIN D 25 HYDROXY (VIT D DEFICIENCY, FRACTURES): VITD: 12.79 ng/mL — ABNORMAL LOW (ref 30.00–100.00)

## 2021-03-22 LAB — HEPATIC FUNCTION PANEL
ALT: 11 U/L (ref 0–35)
AST: 15 U/L (ref 0–37)
Albumin: 3.9 g/dL (ref 3.5–5.2)
Alkaline Phosphatase: 39 U/L (ref 39–117)
Bilirubin, Direct: 0.1 mg/dL (ref 0.0–0.3)
Total Bilirubin: 0.7 mg/dL (ref 0.2–1.2)
Total Protein: 7 g/dL (ref 6.0–8.3)

## 2021-03-22 LAB — BASIC METABOLIC PANEL
BUN: 20 mg/dL (ref 6–23)
CO2: 29 mEq/L (ref 19–32)
Calcium: 9.7 mg/dL (ref 8.4–10.5)
Chloride: 94 mEq/L — ABNORMAL LOW (ref 96–112)
Creatinine, Ser: 0.86 mg/dL (ref 0.40–1.20)
GFR: 80.89 mL/min (ref >60.00)
Glucose, Bld: 261 mg/dL — ABNORMAL HIGH (ref 70–99)
Potassium: 4 mEq/L (ref 3.5–5.1)
Sodium: 130 mEq/L — ABNORMAL LOW (ref 135–145)

## 2021-03-22 LAB — HEMOGLOBIN A1C: Hgb A1c MFr Bld: 8.9 % — ABNORMAL HIGH (ref 4.6–6.5)

## 2021-03-22 LAB — TSH: TSH: 1.96 u[IU]/mL (ref 0.35–5.50)

## 2021-03-22 LAB — LDL CHOLESTEROL, DIRECT: Direct LDL: 60 mg/dL

## 2021-03-22 MED ORDER — VITAMIN D (ERGOCALCIFEROL) 1.25 MG (50000 UNIT) PO CAPS: 50000.0000 [IU] | ORAL_CAPSULE | ORAL | 0 refills | Status: DC

## 2021-03-22 MED ORDER — TRULICITY 3 MG/0.5ML ~~LOC~~ SOAJ: 3.0000 mg | SUBCUTANEOUS | 3 refills | Status: DC

## 2021-03-23 ENCOUNTER — Telehealth: Payer: Self-pay

## 2021-03-23 NOTE — Telephone Encounter (Signed)
-----   Message from Sheliah Hatch, MD sent at 03/22/2021  2:14 PM EST ----- Your A1C is back up to 8.9%.  We will increase the Trulicity to 3mg  weekly (2 doses of the 1.5mg  that you have at home and I will send in a new prescription for the 3mg  dose).  Also, we need to get this sugar under better control so we're going to refer you to Endocrinology.  Your Vit D is low.  Based on this, we need to start prescription 50,000 units weekly x12 weeks in addition to daily OTC supplement of at least 2000 units.   Your Triglycerides are high (fatty party of blood) but this will improve w/ improved diabetes control, healthy diet, and regular exercise.  Remainder of labs are stable

## 2021-03-23 NOTE — Telephone Encounter (Signed)
Spoke to patient and she is aware of labs and has no questions at this time. Aware to pick up vit d rx

## 2021-04-28 ENCOUNTER — Encounter (HOSPITAL_COMMUNITY): Payer: Self-pay

## 2021-04-28 ENCOUNTER — Ambulatory Visit (INDEPENDENT_AMBULATORY_CARE_PROVIDER_SITE_OTHER): Payer: BC Managed Care – PPO

## 2021-04-28 ENCOUNTER — Ambulatory Visit (HOSPITAL_COMMUNITY)
Admission: EM | Admit: 2021-04-28 | Discharge: 2021-04-28 | Disposition: A | Discharge status: Home / Self Care | Payer: BC Managed Care – PPO | Attending: Family Medicine | Admitting: Family Medicine

## 2021-04-28 ENCOUNTER — Other Ambulatory Visit: Payer: Self-pay

## 2021-04-28 DIAGNOSIS — M79671 Pain in right foot: Secondary | ICD-10-CM

## 2021-04-28 DIAGNOSIS — M25571 Pain in right ankle and joints of right foot: Secondary | ICD-10-CM

## 2021-04-28 DIAGNOSIS — M7989 Other specified soft tissue disorders: Secondary | ICD-10-CM | POA: Diagnosis not present

## 2021-04-28 NOTE — ED Triage Notes (Signed)
Onset yesterday of right foot pain that has worsened since the onset. Denies swelling and bruising. Pt localizes pain to the lateral aspect of her right foot. ?At the onset Pt thought that cause was her new steel toe boots that she'd only worn for two days. Sitting relieves sxs. Walking and standing aggravates sxs. Has been taking ibuprofen with some relief. Denies n/t in foot. No falls or injuries. ?

## 2021-04-28 NOTE — ED Provider Notes (Signed)
Belinda Reilly    CSN: [id number] Arrival date & time: 04/28/21  1340      History   Chief Complaint Chief Complaint  Patient presents with   Foot Pain    right    HPI Belinda Reilly is a 46 y.o. female.   Patient is here for right ear pain.  Yesterday she noted the right foot felt "tight".  This was much more painful while being on her foot.   She got new steel toed shoes for work recently.  Same size/shoe she normally wears, so not sure if caused it.  Took the shoe off and it did not look swollen/red.  Just painful and tight to walk;  couldn't bear any weight yesterday.  Even today very painful, but able to walk slightly better this after noon.   Past Medical History:  Diagnosis Date   Allergy    seasonal allergies   Diabetes mellitus without complication ([place])    on meds    Patient Active Problem List   Diagnosis Date Noted   Hyperlipidemia associated with type 2 diabetes mellitus ([place]) 06/06/2018   Plantar fasciitis of right foot 07/13/2015   Physical exam 03/05/2014   Fungal infection of foot 03/05/2014   Perforated nasal septum 07/01/2013   DM (diabetes mellitus), type 2 ([place]) 03/26/2013   Family history of breast cancer in first degree relative 12/18/2012   Severe obesity (BMI >= 40) ([place]) 12/18/2012   Screening for malignant neoplasm of cervix 12/18/2012   Routine gynecological examination 12/18/2012    Past Surgical History:  Procedure Laterality Date   CHOLECYSTECTOMY  6701   UMBILICAL HERNIA REPAIR  2000   WISDOM TOOTH EXTRACTION      OB History   No obstetric history on file.      Home Medications    Prior to Admission medications   Medication Sig Start Date End Date Taking? Authorizing Provider  Blood Glucose Calibration (ONETOUCH VERIO) SOLN Use solution to calibrate glucometer per instructions 02/08/17   Midge Minium, MD  Blood Glucose Monitoring Suppl (ONETOUCH VERIO) w/Device KIT 1 kit by Does not apply route daily. To  check sugars twice daily 02/08/17   Midge Minium, MD  Dulaglutide (TRULICITY) 3 ID/0.3UD SOPN Inject 3 mg as directed once a week. 03/22/21   Midge Minium, MD  glucose blood (ONETOUCH VERIO) test strip Use one strip each time sugars are tested. Pt will check sugars twice daily. 02/08/17   Midge Minium, MD  ibuprofen (ADVIL,MOTRIN) 200 MG tablet Take 200 mg by mouth every 6 (six) hours as needed.    [provider]  loratadine (CLARITIN) 10 MG tablet Take 10 mg by mouth daily as needed.    [provider]  metFORMIN (GLUCOPHAGE) 1000 MG tablet TAKE 1 TABLET(1000 MG) BY MOUTH TWICE DAILY 09/17/20   Midge Minium, MD  Norethindrone Acetate-Ethinyl Estradiol (LOESTRIN) 1.5-30 MG-MCG tablet Take 1 tablet by mouth daily. 12/14/20   Midge Minium, MD  [facility] DELICA LANCETS 31Y MISC Use one lancet each time sugars are tested. Pt will check sugars twice daily. 02/08/17   Midge Minium, MD  PROAIR HFA 108 [phone] Base) MCG/ACT inhaler INHALE 2 PUFFS INTO THE LUNGS EVERY 4 HOURS AS NEEDED FOR WHEEZING OR SHORTNESS OF BREATH 05/07/18   Midge Minium, MD  rosuvastatin (CRESTOR) 10 MG tablet Take 1 tablet (10 mg total) by mouth daily. 12/15/20   Midge Minium, MD  Vitamin D, Ergocalciferol, (DRISDOL)  1.25 MG (50000 UNIT) CAPS capsule Take 1 capsule (50,000 Units total) by mouth every 7 (seven) days. 03/22/21   Midge Minium, MD    Family History Family History  Problem Relation Age of Onset   Lung cancer Mother    Breast cancer Mother 22   Deep vein thrombosis Mother    Heart Problems Mother        had pacemaker   Diabetes Mother    Diabetes Father        controls with diet/exercise   Esophageal cancer Maternal Uncle    Esophageal cancer Maternal Grandfather    Diabetes Cousin    Alzheimer's disease Maternal Aunt    Alzheimer's disease Maternal Grandmother    Hypertension Maternal Grandmother    Colon cancer Neg Hx    Colon polyps  Neg Hx    Stomach cancer Neg Hx    Rectal cancer Neg Hx     Social History Social History   Tobacco Use   Smoking status: Never   Smokeless tobacco: Never  Vaping Use   Vaping Use: Never used  Substance Use Topics   Alcohol use: Yes    Comment: 3 per month   Drug use: No     Allergies   Patient has no known allergies.   Review of Systems Review of Systems  Constitutional: Negative.   HENT: Negative.    Respiratory: Negative.    Cardiovascular: Negative.   Gastrointestinal: Negative.     Physical Exam Triage Vital Signs ED Triage Vitals  Enc Vitals Group     BP 04/28/21 1506 135/73     Pulse Rate 04/28/21 1506 93     Resp 04/28/21 1506 18     Temp 04/28/21 1506 98.7 F (37.1 C)     Temp Source 04/28/21 1506 Oral     SpO2 04/28/21 1506 98 %     Weight --      Height --      Head Circumference --      Peak Flow --      Pain Score 04/28/21 1501 5     Pain Loc --      Pain Edu? --      Excl. in [place]? --    No data found.  Updated Vital Signs BP 135/73 (BP Location: Left Arm)    Pulse 93    Temp 98.7 F (37.1 C) (Oral)    Resp 18    SpO2 98%   Visual Acuity Right Eye Distance:   Left Eye Distance:   Bilateral Distance:    Right Eye Near:   Left Eye Near:    Bilateral Near:     Physical Exam Constitutional:      Appearance: Normal appearance.  Musculoskeletal:     Comments: Slight swelling to the right lateral foot, ankle;  TTP to the medial and anterior ankle;  TTP to the 3rd-5th metatarsals;  full rom without pain or limitations  Neurological:     Mental Status: She is alert.     UC Treatments / Results  Labs (all labs ordered are listed, but only abnormal results are displayed) Labs Reviewed - No data to display  EKG   Radiology DG Ankle Complete Right  Result Date: 04/28/2021 CLINICAL DATA:  Pain.  Swelling and bruising. EXAM: RIGHT ANKLE - COMPLETE 3+ VIEW; RIGHT FOOT COMPLETE - 3+ VIEW COMPARISON:  None. FINDINGS: Right ankle: There  are punctate densities adjacent to the tip of the medial malleolus which may  represent small avulsion fractures, age indeterminate. No other fractures are visualized. Joint spaces are well maintained. There is lateral soft tissue swelling. Right foot: No evidence for fracture or dislocation. Joint spaces are well maintained. There are plantar and posterior calcaneal spurs. Soft tissues are within normal limits. IMPRESSION: 1. Findings worrisome for tiny avulsion fractures adjacent to the tip of the right medial malleolus, age indeterminate. 2. There is significant medial ankle soft tissue swelling. 3. No acute fracture or dislocation of the right foot. Electronically Signed   By: Ronney Asters M.D.   On: 04/28/2021 15:35   DG Foot Complete Right  Result Date: 04/28/2021 CLINICAL DATA:  Pain.  Swelling and bruising. EXAM: RIGHT ANKLE - COMPLETE 3+ VIEW; RIGHT FOOT COMPLETE - 3+ VIEW COMPARISON:  None. FINDINGS: Right ankle: There are punctate densities adjacent to the tip of the medial malleolus which may represent small avulsion fractures, age indeterminate. No other fractures are visualized. Joint spaces are well maintained. There is lateral soft tissue swelling. Right foot: No evidence for fracture or dislocation. Joint spaces are well maintained. There are plantar and posterior calcaneal spurs. Soft tissues are within normal limits. IMPRESSION: 1. Findings worrisome for tiny avulsion fractures adjacent to the tip of the right medial malleolus, age indeterminate. 2. There is significant medial ankle soft tissue swelling. 3. No acute fracture or dislocation of the right foot. Electronically Signed   By: Ronney Asters M.D.   On: 04/28/2021 15:35    Procedures Procedures (including critical care time)  Medications Ordered in UC Medications - No data to display  Initial Impression / Assessment and Plan / UC Course  I have reviewed the triage vital signs and the nursing notes.  Pertinent labs & imaging  results that were available during my care of the patient were reviewed by me and considered in my medical decision making (see chart for details).  Patient seen for right lateral foot pain, as well as medial ankle pain.  No known injury.  Xray of foot negative, but xray ankle shows possible small avulsions fractures.  She denies any trauma, but she is tender at this location today.  Will place in a cam boot today and recommend she f/u with orthopedist.  Information given for this today.  Rest, ice, elevation for pain.    Final Clinical Impressions(s) / UC Diagnoses   Final diagnoses:  Right foot pain  Acute right ankle pain     Discharge Instructions      You were seen today for right foot and ankle pain.  The xray of the foot was negative for fracture.  The xray of the ankle questions a medial avulsion fractures.  I have placed you in a boot today and recommend follow up with orthopedist.  You may call [facility] at  [phone] .   I recommend rest, elevation, ice, and motrin for pain/swelling.  I advise not wearing the new shoe/boot at this time.  I    ED Prescriptions   None    PDMP not reviewed this encounter.   Rondel Oh, MD 04/28/21 1558

## 2021-04-28 NOTE — Discharge Instructions (Addendum)
You were seen today for right foot and ankle pain.  ?The xray of the foot was negative for fracture.  ?The xray of the ankle questions a medial avulsion fractures.  I have placed you in a boot today and recommend follow up with orthopedist.  You may call [facility] at  [phone] .  ? I recommend rest, elevation, ice, and motrin for pain/swelling.  I advise not wearing the new shoe/boot at this time.  ?I ?

## 2021-05-02 DIAGNOSIS — M25571 Pain in right ankle and joints of right foot: Secondary | ICD-10-CM | POA: Diagnosis not present

## 2021-05-13 ENCOUNTER — Telehealth: Payer: Self-pay

## 2021-05-13 ENCOUNTER — Telehealth: Payer: BC Managed Care – PPO | Admitting: Nurse Practitioner

## 2021-05-13 DIAGNOSIS — J069 Acute upper respiratory infection, unspecified: Secondary | ICD-10-CM | POA: Diagnosis not present

## 2021-05-13 MED ORDER — PROMETHAZINE-DM 6.25-15 MG/5ML PO SYRP: 5.0000 mL | ORAL_SOLUTION | Freq: Four times a day (QID) | ORAL | 0 refills | Status: DC | PRN

## 2021-05-13 MED ORDER — PREDNISONE 20 MG PO TABS: 40.0000 mg | ORAL_TABLET | Freq: Every day | ORAL | 0 refills | Status: AC

## 2021-05-13 NOTE — Telephone Encounter (Signed)
LAST APPOINTMENT DATE:  03/16/2021 ? ?NEXT APPOINTMENT DATE: ? ?MEDICATION: PROAIR HFA 108 (90 Base) MCG/ACT inhaler ? ?Is the patient out of medication? yes ? ?PHARMACY: [id number] - [place], [place] - [address] AT [facility] OF [address] ? ? ? ? ? ? ?

## 2021-05-13 NOTE — Patient Instructions (Signed)
1. Take meds as prescribed 2. Use a cool mist humidifier especially during the winter months and when heat has been humid. 3. Use saline nose sprays frequently 4. Saline irrigations of the nose can be very helpful if done frequently.  * 4X daily for 1 week*  * Use of a nettie pot can be helpful with this. Follow directions with this* 5. Drink plenty of fluids 6. Keep thermostat turn down low 7.For any cough or congestion- promethazine dm 8. For fever or aces or pains- take tylenol or ibuprofen appropriate for age and weight.  * for fevers greater than 101 orally you may alternate ibuprofen and tylenol every  3 hours.    

## 2021-05-13 NOTE — Progress Notes (Signed)
? ?Virtual Visit Consent  ? ?Pauleen S Iran, you are scheduled for a virtual visit with Mary-Margaret Hassell Done, [place], a [facility] provider, today.   ?  ?Just as with appointments in the office, your consent must be obtained to participate.  Your consent will be active for this visit and any virtual visit you may have with one of our providers in the next 365 days.   ?  ?If you have a MyChart account, a copy of this consent can be sent to you electronically.  All virtual visits are billed to your insurance company just like a traditional visit in the office.   ? ?As this is a virtual visit, video technology does not allow for your provider to perform a traditional examination.  This may limit your provider's ability to fully assess your condition.  If your provider identifies any concerns that need to be evaluated in person or the need to arrange testing (such as labs, EKG, etc.), we will make arrangements to do so.   ?  ?Although advances in technology are sophisticated, we cannot ensure that it will always work on either your end or our end.  If the connection with a video visit is poor, the visit may have to be switched to a telephone visit.  With either a video or telephone visit, we are not always able to ensure that we have a secure connection.    ? ?I need to obtain your verbal consent now.   Are you willing to proceed with your visit today? YES ?  ?Carly Sabo Iran has provided verbal consent on 05/13/2021 for a virtual visit (video or telephone). ?  ?Mary-Margaret Hassell Done, FNP  ? ?Date: 05/13/2021 11:17 AM ? ? ?Virtual Visit via Video Note  ? ?I, Mary-Margaret Hassell Done, connected with Taiya S Iran ([id number], 05/11/1975) on 05/13/21 at 11:30 AM EDT by a video-enabled telemedicine application and verified that I am speaking with the correct person using two identifiers. ? ?Location: ?Patient: Virtual Visit Location Patient: Other: work ?Provider: Virtual Visit Location Provider: Mobile ?  ?I discussed the  limitations of evaluation and management by telemedicine and the availability of in person appointments. The patient expressed understanding and agreed to proceed.   ? ?History of Present Illness: ?Verda Mehta Iran is a 46 y.o. who identifies as a female who was assigned female at birth, and is being seen today for cough. ? ?HPI: Cough ?This is a new problem. The current episode started 1 to 4 weeks ago. The problem has been waxing and waning. The problem occurs every few minutes. The cough is Non-productive. Associated symptoms include rhinorrhea. Pertinent negatives include no ear pain, fever or heartburn. Nothing aggravates the symptoms. She has tried OTC cough suppressant for the symptoms. The treatment provided mild relief. There is no history of asthma, bronchiectasis or COPD.   ?Review of Systems  ?Constitutional:  Negative for fever.  ?HENT:  Positive for rhinorrhea. Negative for ear pain.   ?Respiratory:  Positive for cough.   ?Gastrointestinal:  Negative for heartburn.  ? ?Problems:  ?Patient Active Problem List  ? Diagnosis Date Noted  ? Hyperlipidemia associated with type 2 diabetes mellitus ([place]) 06/06/2018  ? Plantar fasciitis of right foot 07/13/2015  ? Physical exam 03/05/2014  ? Fungal infection of foot 03/05/2014  ? Perforated nasal septum 07/01/2013  ? DM (diabetes mellitus), type 2 ([place]) 03/26/2013  ? Family history of breast cancer in first degree relative 12/18/2012  ? Severe obesity (BMI >= 40) ([facility])  12/18/2012  ? Screening for malignant neoplasm of cervix 12/18/2012  ? Routine gynecological examination 12/18/2012  ?  ?Allergies: No Known Allergies ?Medications:  ?Current Outpatient Medications:  ?  Blood Glucose Calibration (ONETOUCH VERIO) SOLN, Use solution to calibrate glucometer per instructions, Disp: 1 each, Rfl: 3 ?  Blood Glucose Monitoring Suppl (ONETOUCH VERIO) w/Device KIT, 1 kit by Does not apply route daily. To check sugars twice daily, Disp: 1 kit, Rfl: 1 ?  Dulaglutide  (TRULICITY) 3 MC/9.4BS SOPN, Inject 3 mg as directed once a week., Disp: 2 mL, Rfl: 3 ?  glucose blood (ONETOUCH VERIO) test strip, Use one strip each time sugars are tested. Pt will check sugars twice daily., Disp: 50 each, Rfl: 12 ?  ibuprofen (ADVIL,MOTRIN) 200 MG tablet, Take 200 mg by mouth every 6 (six) hours as needed., Disp: , Rfl:  ?  loratadine (CLARITIN) 10 MG tablet, Take 10 mg by mouth daily as needed., Disp: , Rfl:  ?  metFORMIN (GLUCOPHAGE) 1000 MG tablet, TAKE 1 TABLET(1000 MG) BY MOUTH TWICE DAILY, Disp: 180 tablet, Rfl: 1 ?  Norethindrone Acetate-Ethinyl Estradiol (LOESTRIN) 1.5-30 MG-MCG tablet, Take 1 tablet by mouth daily., Disp: 28 tablet, Rfl: 11 ?  ONETOUCH DELICA LANCETS 96G MISC, Use one lancet each time sugars are tested. Pt will check sugars twice daily., Disp: 100 each, Rfl: 12 ?  PROAIR HFA 108 (90 Base) MCG/ACT inhaler, INHALE 2 PUFFS INTO THE LUNGS EVERY 4 HOURS AS NEEDED FOR WHEEZING OR SHORTNESS OF BREATH, Disp: 8.5 g, Rfl: 0 ?  rosuvastatin (CRESTOR) 10 MG tablet, Take 1 tablet (10 mg total) by mouth daily., Disp: 90 tablet, Rfl: 3 ?  Vitamin D, Ergocalciferol, (DRISDOL) 1.25 MG (50000 UNIT) CAPS capsule, Take 1 capsule (50,000 Units total) by mouth every 7 (seven) days., Disp: 12 capsule, Rfl: 0 ? ?Current Facility-Administered Medications:  ?  0.9 %  sodium chloride infusion, 500 mL, Intravenous, Continuous, Armbruster, Carlota Raspberry, MD ? ?Observations/Objective: ?Patient is well-developed, well-nourished in no acute distress.  ?Resting comfortably  at home.  ?Head is normocephalic, atraumatic.  ?No labored breathing.  ?Speech is clear and coherent with logical content.  ?Patient is alert and oriented at baseline.  ?Slight dry cough noted ? ?Assessment and Plan: ? ?Janee S Iran in today with chief complaint of Cough ? ? ?1. Viral upper respiratory tract infection with cough ? ? ?1. Take meds as prescribed ?2. Use a cool mist humidifier especially during the winter months and when  heat has been humid. ?3. Use saline nose sprays frequently ?4. Saline irrigations of the nose can be very helpful if done frequently. ? * 4X daily for 1 week* ? * Use of a nettie pot can be helpful with this. Follow directions with this* ?5. Drink plenty of fluids ?6. Keep thermostat turn down low ?7.For any cough or congestion- prometazine dm ?8. For fever or aces or pains- take tylenol or ibuprofen appropriate for age and weight. ? * for fevers greater than 101 orally you may alternate ibuprofen and tylenol every  3 hours. ?  ?Meds ordered this encounter  ?Medications  ? promethazine-dextromethorphan (PROMETHAZINE-DM) 6.25-15 MG/5ML syrup  ?  Sig: Take 5 mLs by mouth 4 (four) times daily as needed for cough.  ?  Dispense:  118 mL  ?  Refill:  0  ?  Order Specific Question:   Supervising Provider  ?  Answer:   Noemi Chapel [[id number]]  ? predniSONE (DELTASONE) 20 MG tablet  ?  Sig:  Take 2 tablets (40 mg total) by mouth daily with breakfast for 5 days. 2 po daily for 5 days  ?  Dispense:  10 tablet  ?  Refill:  0  ?  Order Specific Question:   Supervising Provider  ?  Answer:   Noemi Chapel [[id number]]  ? ? ? ? ?Follow Up Instructions: ?I discussed the assessment and treatment plan with the patient. The patient was provided an opportunity to ask questions and all were answered. The patient agreed with the plan and demonstrated an understanding of the instructions.  A copy of instructions were sent to the patient via MyChart. ? ?The patient was advised to call back or seek an in-person evaluation if the symptoms worsen or if the condition fails to improve as anticipated. ? ?Time:  ?I spent 8 minutes with the patient via telehealth technology discussing the above problems/concerns.   ? ?Mary-Margaret Hassell Done, FNP ? ?

## 2021-05-16 ENCOUNTER — Other Ambulatory Visit: Payer: Self-pay

## 2021-05-16 MED ORDER — ALBUTEROL SULFATE HFA 108 (90 BASE) MCG/ACT IN AERS: 2.0000 | INHALATION_SPRAY | RESPIRATORY_TRACT | 0 refills | Status: DC | PRN

## 2021-05-16 NOTE — Telephone Encounter (Signed)
Patient called in asking for an update, advised the 48 hour turn around time.  ?

## 2021-05-16 NOTE — Telephone Encounter (Signed)
Rx sent 

## 2021-06-02 ENCOUNTER — Other Ambulatory Visit: Payer: Self-pay

## 2021-06-02 MED ORDER — METFORMIN HCL 1000 MG PO TABS: ORAL_TABLET | ORAL | 1 refills | Status: DC

## 2021-06-04 IMAGING — MG MM DIGITAL SCREENING BILAT W/ TOMO AND CAD
7 series · 8 of 27 positions shown · non-contrast
Comparison: Previous exam(s).

ACR Breast Density Category a: The breast tissue is almost entirely
fatty.

CLINICAL DATA: Screening.

EXAM:
DIGITAL SCREENING BILATERAL MAMMOGRAM WITH TOMOSYNTHESIS AND CAD
TECHNIQUE: Bilateral screening digital craniocaudal and mediolateral oblique
mammograms were obtained. Bilateral screening digital breast
tomosynthesis was performed. The images were evaluated with
computer-aided detection.

[R CC synth-2D]
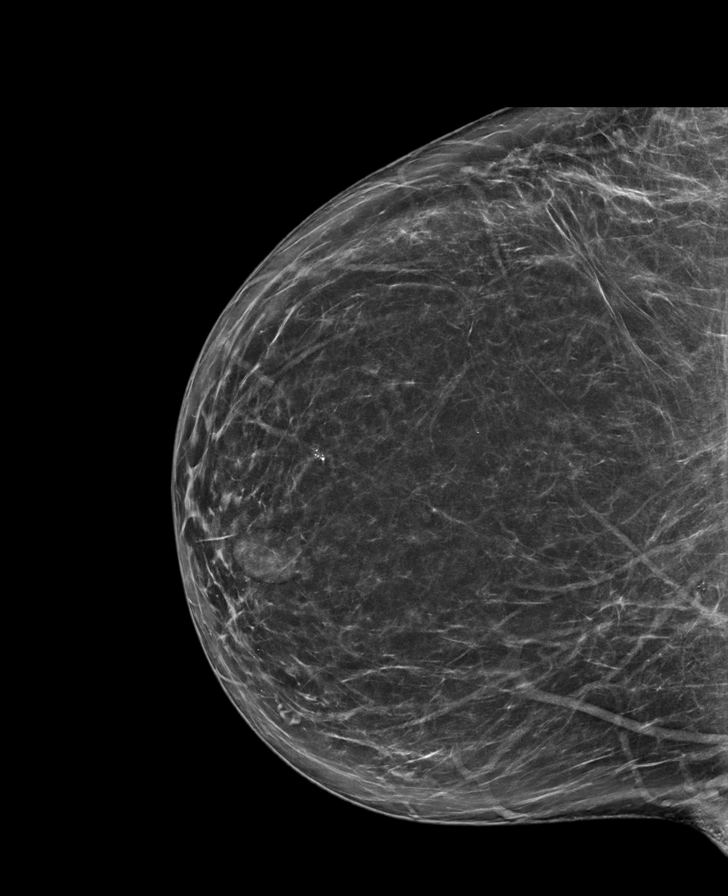

[L MLO synth-2D]
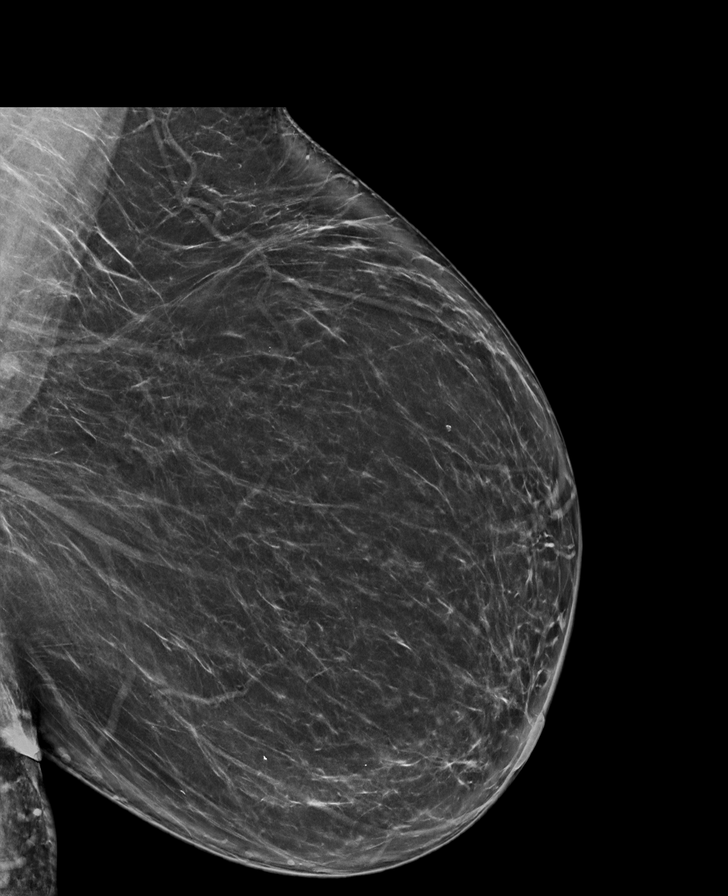

[R CC tomo · 2 of 84 frames shown]
[frame 28/84]
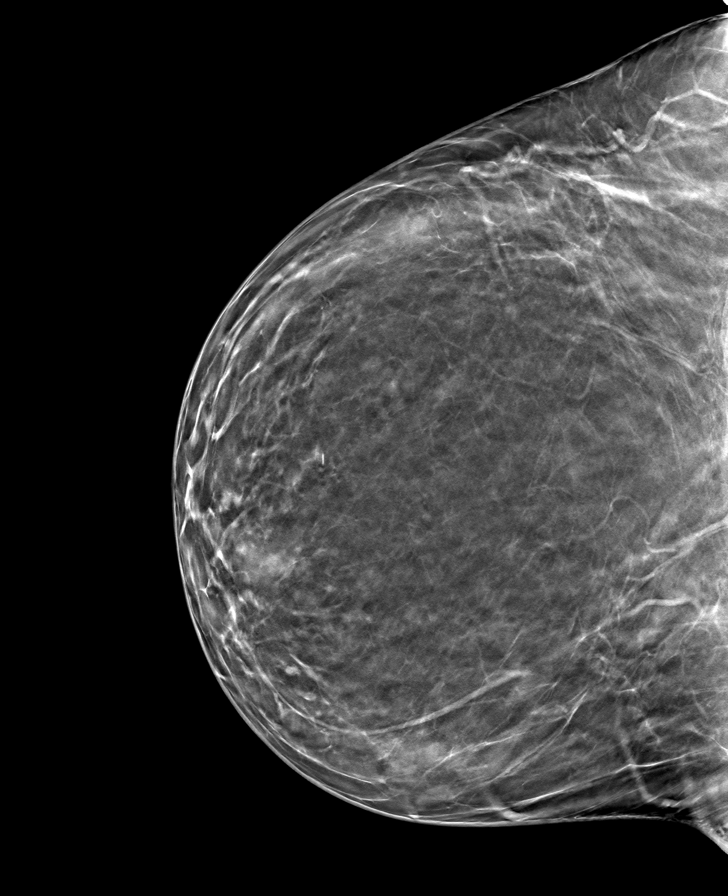
[frame 43/84]
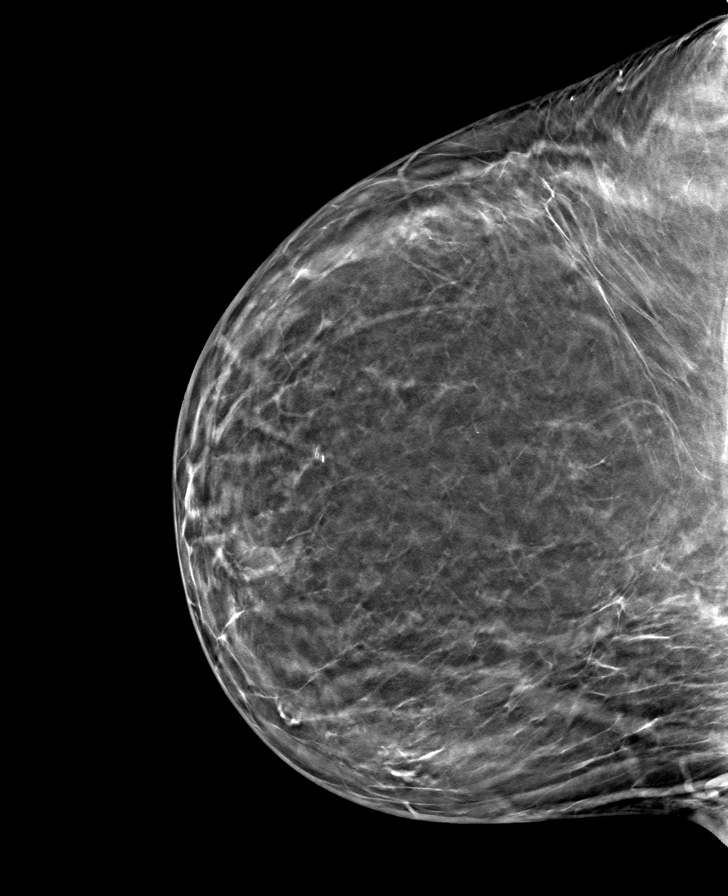

[L CC tomo · tomo slice 45/88.0]
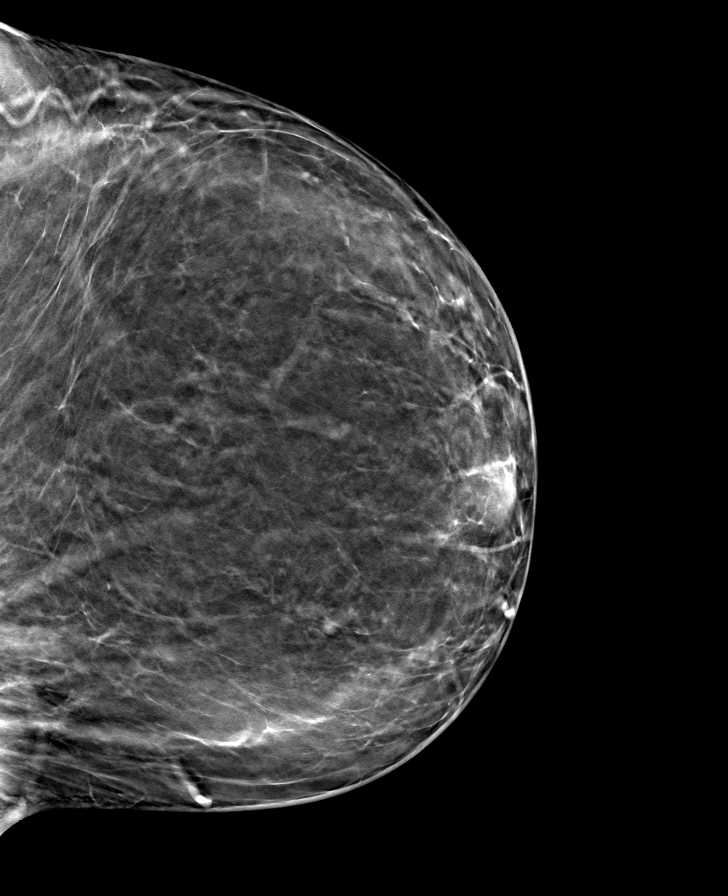

[R MLO tomo · tomo slice 45/90.0]
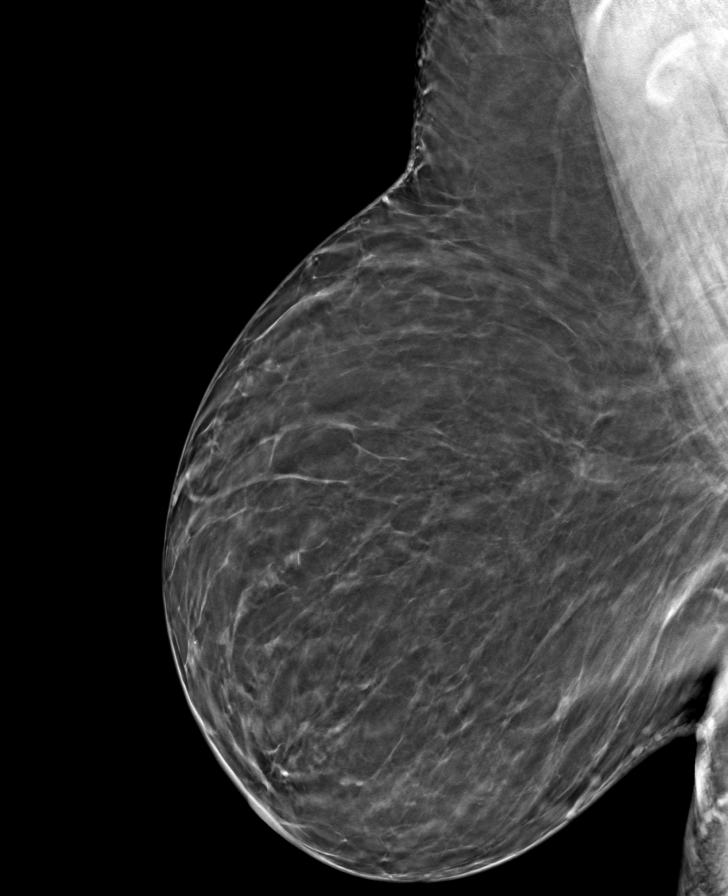

[L MLO tomo (1 of 2) · tomo slice 46/91.0]
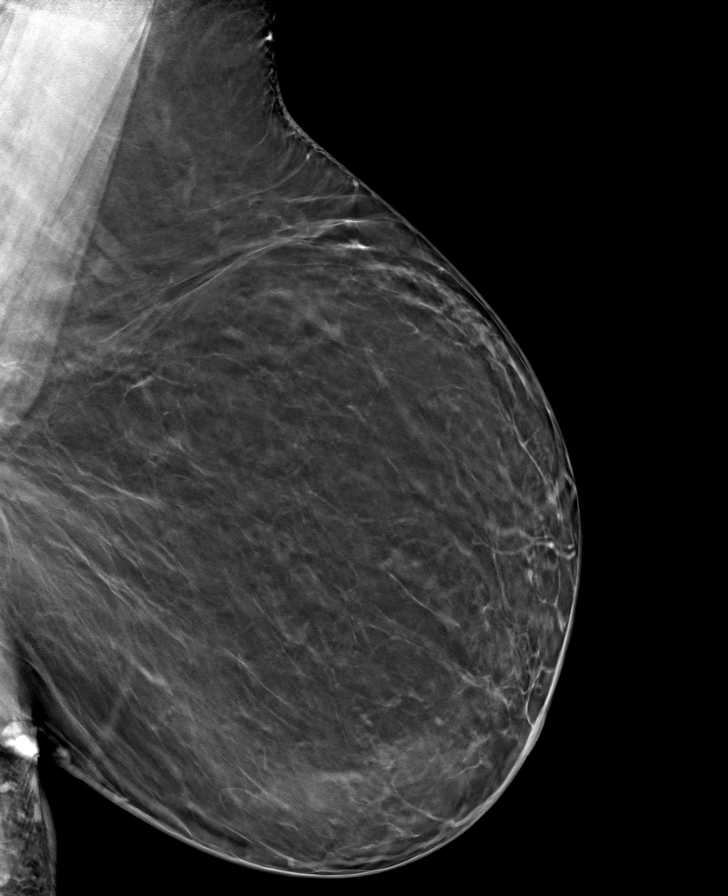

[L MLO tomo (2 of 2) · tomo slice 47/92.0]
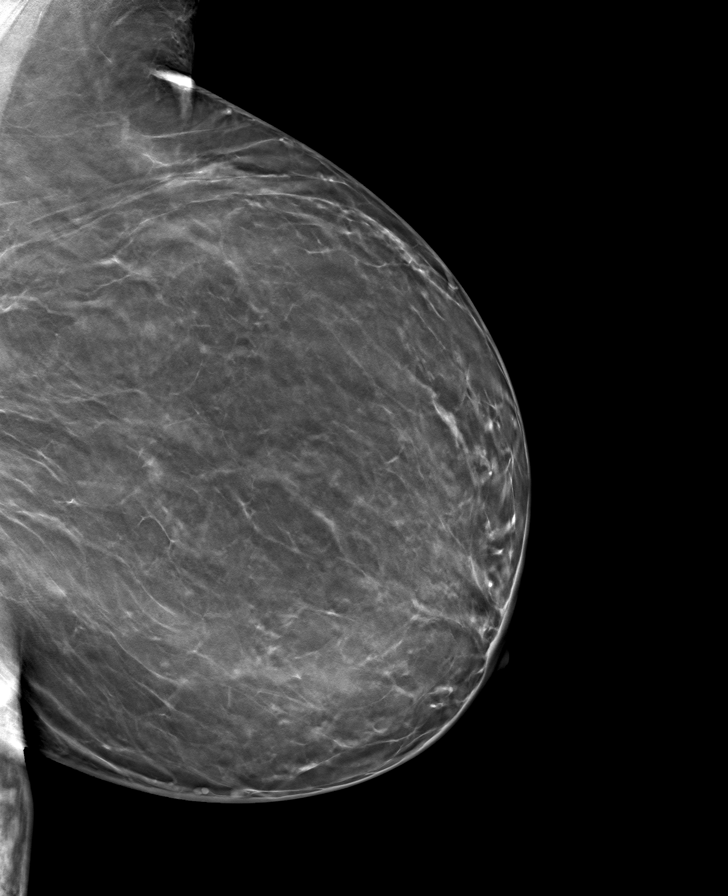

[8 of 27 positions shown; findings below may reference images not displayed]

FINDINGS: There are no findings suspicious for malignancy. The images were
evaluated with computer-aided detection.
IMPRESSION: No mammographic evidence of malignancy. A result letter of this
screening mammogram will be mailed directly to the patient.

RECOMMENDATION:
Screening mammogram in one year. (Code:JP-J-DD5)

BI-RADS CATEGORY  1: Negative.

## 2021-06-16 ENCOUNTER — Other Ambulatory Visit (INDEPENDENT_AMBULATORY_CARE_PROVIDER_SITE_OTHER): Payer: BC Managed Care – PPO

## 2021-06-16 ENCOUNTER — Encounter: Payer: Self-pay | Admitting: Family Medicine

## 2021-06-16 ENCOUNTER — Ambulatory Visit (INDEPENDENT_AMBULATORY_CARE_PROVIDER_SITE_OTHER): Payer: BC Managed Care – PPO | Admitting: Family Medicine

## 2021-06-16 VITALS — BP 124/80 | HR 93 | Temp 97.5°F | Resp 18 | Ht 65.5 in | Wt 247.5 lb

## 2021-06-16 DIAGNOSIS — E119 Type 2 diabetes mellitus without complications: Secondary | ICD-10-CM

## 2021-06-16 LAB — BASIC METABOLIC PANEL
BUN: 10 mg/dL (ref 6–23)
CO2: 25 mEq/L (ref 19–32)
Calcium: 9.3 mg/dL (ref 8.4–10.5)
Chloride: 98 mEq/L (ref 96–112)
Creatinine, Ser: 0.79 mg/dL (ref 0.40–1.20)
GFR: 89.41 mL/min (ref >60.00)
Glucose, Bld: 201 mg/dL — ABNORMAL HIGH (ref 70–99)
Potassium: 3.7 mEq/L (ref 3.5–5.1)
Sodium: 133 mEq/L — ABNORMAL LOW (ref 135–145)

## 2021-06-16 LAB — HEMOGLOBIN A1C: Hgb A1c MFr Bld: 8.2 % — ABNORMAL HIGH (ref 4.6–6.5)

## 2021-06-16 NOTE — Assessment & Plan Note (Signed)
Chronic problem.  Pt has hx of poor sugar control and her 6 lb weight loss is concerning for elevated sugars (although, she has been under considerable stress).  UTD on foot exam, eye exam, and microalbumin.  Tolerating Trulicity and Metformin w/o difficulty.  Check labs.  Adjust meds prn  ?

## 2021-06-16 NOTE — Patient Instructions (Signed)
Follow up in 3-4 months to recheck sugars and cholesterol ?You can go to [facility] for labs or schedule a lab visit here ?We'll notify you of your lab results and make any changes if needed ?Continue to work on healthy diet and regular exercise- you can do it!!! ?Call with any questions or concerns ?Stay Safe!  Stay Healthy! ?Happy Spring!!! ?

## 2021-06-16 NOTE — Progress Notes (Signed)
? ?  Subjective:  ? ? Patient ID: Belinda Reilly, female    DOB: [birth year], 46 y.o.   MRN: [id number] ? ?HPI ?DM- chronic problem, on Trulicity 3mg  weekly, Metformin 1000mg  BID.  UTD on foot exam, eye exam, microalbumin.  Pt is down another 6 lbs.  No CP, SOB, HAs.  Some blurry vision.  No abd pain, N/V.  Occasional numbness of feet.  Denies symptomatic lows. ? ? ?Review of Systems ?For ROS see HPI  ?   ?Objective:  ? Physical Exam ?Vitals reviewed.  ?Constitutional:   ?   General: She is not in acute distress. ?   Appearance: Normal appearance. She is well-developed. She is obese.  ?HENT:  ?   Head: Normocephalic and atraumatic.  ?Eyes:  ?   Conjunctiva/sclera: Conjunctivae normal.  ?   Pupils: Pupils are equal, round, and reactive to light.  ?Neck:  ?   Thyroid: No thyromegaly.  ?Cardiovascular:  ?   Rate and Rhythm: Normal rate and regular rhythm.  ?   Pulses: Normal pulses.  ?   Heart sounds: Normal heart sounds. No murmur heard. ?Pulmonary:  ?   Effort: Pulmonary effort is normal. No respiratory distress.  ?   Breath sounds: Normal breath sounds.  ?Abdominal:  ?   General: There is no distension.  ?   Palpations: Abdomen is soft.  ?   Tenderness: There is no abdominal tenderness.  ?Musculoskeletal:  ?   Cervical back: Normal range of motion and neck supple.  ?   Right lower leg: No edema.  ?   Left lower leg: No edema.  ?Lymphadenopathy:  ?   Cervical: No cervical adenopathy.  ?Skin: ?   General: Skin is warm and dry.  ?Neurological:  ?   Mental Status: She is alert and oriented to person, place, and time.  ?Psychiatric:     ?   Behavior: Behavior normal.  ? ? ? ? ? ?   ?Assessment & Plan:  ? ? ?

## 2021-06-21 ENCOUNTER — Telehealth: Payer: Self-pay

## 2021-06-21 NOTE — Telephone Encounter (Signed)
Left pt a VM to call office about lab results  ?

## 2021-06-21 NOTE — Telephone Encounter (Signed)
-----   Message from Sheliah Hatch, MD sent at 06/20/2021  8:35 PM EDT ----- ?A1C looks better than last check- great news!!  This will continue to improve w/ low carb diet and regular exercise. ? ?No med changes at this time but will follow closely ?

## 2021-06-28 ENCOUNTER — Telehealth: Payer: Self-pay

## 2021-06-28 NOTE — Telephone Encounter (Signed)
Spoke w/ pt and informed that her Trulicity was approved from 05/28/21 to 06/27/22 .  Belinda Reilly ?

## 2021-09-07 ENCOUNTER — Telehealth: Payer: Self-pay

## 2021-09-07 NOTE — Telephone Encounter (Signed)
I need more information in order to fill out these forms.  I'm not sure what she needs the time off for, how long she needs to be off, is this a continuous leave or intermittent leave?

## 2021-09-13 DIAGNOSIS — Z0279 Encounter for issue of other medical certificate: Secondary | ICD-10-CM

## 2021-09-13 NOTE — Telephone Encounter (Signed)
Faxed forms back to [employer] . Copied made as well for scan .

## 2021-09-13 NOTE — Telephone Encounter (Signed)
Forms completed and given to CMA to fax.  

## 2021-09-15 ENCOUNTER — Encounter: Payer: Self-pay | Admitting: Family Medicine

## 2021-09-15 ENCOUNTER — Ambulatory Visit (INDEPENDENT_AMBULATORY_CARE_PROVIDER_SITE_OTHER): Payer: BC Managed Care – PPO | Admitting: Family Medicine

## 2021-09-15 VITALS — BP 130/80 | HR 88 | Temp 97.4°F | Resp 16 | Ht 65.5 in | Wt 245.8 lb

## 2021-09-15 DIAGNOSIS — E785 Hyperlipidemia, unspecified: Secondary | ICD-10-CM | POA: Diagnosis not present

## 2021-09-15 DIAGNOSIS — E1169 Type 2 diabetes mellitus with other specified complication: Secondary | ICD-10-CM

## 2021-09-15 DIAGNOSIS — E119 Type 2 diabetes mellitus without complications: Secondary | ICD-10-CM

## 2021-09-15 LAB — LIPID PANEL
Cholesterol: 137 mg/dL (ref 0–200)
HDL: 45.2 mg/dL (ref >39.00)
NonHDL: 91.46
Total CHOL/HDL Ratio: 3
Triglycerides: 245 mg/dL — ABNORMAL HIGH (ref 0.0–149.0)
VLDL: 49 mg/dL — ABNORMAL HIGH (ref 0.0–40.0)

## 2021-09-15 LAB — CBC WITH DIFFERENTIAL/PLATELET
Basophils Absolute: 0.1 10*3/uL (ref 0.0–0.1)
Basophils Relative: 0.6 % (ref 0.0–3.0)
Eosinophils Absolute: 0.1 10*3/uL (ref 0.0–0.7)
Eosinophils Relative: 1.1 % (ref 0.0–5.0)
HCT: 33.8 % — ABNORMAL LOW (ref 36.0–46.0)
Hemoglobin: 11.1 g/dL — ABNORMAL LOW (ref 12.0–15.0)
Lymphocytes Relative: 27.2 % (ref 12.0–46.0)
Lymphs Abs: 2.8 10*3/uL (ref 0.7–4.0)
MCHC: 32.8 g/dL (ref 30.0–36.0)
MCV: 80 fl (ref 78.0–100.0)
Monocytes Absolute: 0.4 10*3/uL (ref 0.1–1.0)
Monocytes Relative: 3.9 % (ref 3.0–12.0)
Neutro Abs: 6.9 10*3/uL (ref 1.4–7.7)
Neutrophils Relative %: 67.2 % (ref 43.0–77.0)
Platelets: 362 10*3/uL (ref 150.0–400.0)
RBC: 4.23 Mil/uL (ref 3.87–5.11)
RDW: 15.6 % — ABNORMAL HIGH (ref 11.5–15.5)
WBC: 10.3 10*3/uL (ref 4.0–10.5)

## 2021-09-15 LAB — HEPATIC FUNCTION PANEL
ALT: 11 U/L (ref 0–35)
AST: 16 U/L (ref 0–37)
Albumin: 4 g/dL (ref 3.5–5.2)
Alkaline Phosphatase: 44 U/L (ref 39–117)
Bilirubin, Direct: 0.1 mg/dL (ref 0.0–0.3)
Total Bilirubin: 0.8 mg/dL (ref 0.2–1.2)
Total Protein: 7.8 g/dL (ref 6.0–8.3)

## 2021-09-15 LAB — BASIC METABOLIC PANEL
BUN: 12 mg/dL (ref 6–23)
CO2: 27 mEq/L (ref 19–32)
Calcium: 9.5 mg/dL (ref 8.4–10.5)
Chloride: 100 mEq/L (ref 96–112)
Creatinine, Ser: 0.88 mg/dL (ref 0.40–1.20)
GFR: 78.42 mL/min (ref >60.00)
Glucose, Bld: 178 mg/dL — ABNORMAL HIGH (ref 70–99)
Potassium: 4.3 mEq/L (ref 3.5–5.1)
Sodium: 135 mEq/L (ref 135–145)

## 2021-09-15 LAB — TSH: TSH: 2.04 u[IU]/mL (ref 0.35–5.50)

## 2021-09-15 LAB — LDL CHOLESTEROL, DIRECT: Direct LDL: 70 mg/dL

## 2021-09-15 LAB — HEMOGLOBIN A1C: Hgb A1c MFr Bld: 8.6 % — ABNORMAL HIGH (ref 4.6–6.5)

## 2021-09-15 NOTE — Progress Notes (Signed)
   Subjective:    Patient ID: Belinda Reilly, female    DOB: 1975-06-21, 46 y.o.   MRN: [id number]  HPI DM- chronic problem, on Trulicity 3mg  weekly, Metformin 1000mg  BID.  UTD on eye exam.  Foot exam due today.  Due for microalbumin.  Last A1C 8.2%.  Not checking sugars at home.  Denies dizziness, HAs, visual changes, CP, SOB.  No symptomatic lows.  Denies numbness of hands/feet.  Hyperlipidemia- chronic problem, prescribed Crestor 10mg  but not taking.  No abd pain, N/V.  Obesity- BMI 40.28  Down 3 lbs since last visit.  No regular exercise.    Review of Systems For ROS see HPI     Objective:   Physical Exam Vitals reviewed.  Constitutional:      General: She is not in acute distress.    Appearance: Normal appearance. She is well-developed. She is obese. She is not ill-appearing.  HENT:     Head: Normocephalic and atraumatic.  Eyes:     Conjunctiva/sclera: Conjunctivae normal.     Pupils: Pupils are equal, round, and reactive to light.  Neck:     Thyroid: No thyromegaly.  Cardiovascular:     Rate and Rhythm: Normal rate and regular rhythm.     Pulses: Normal pulses.     Heart sounds: Normal heart sounds. No murmur heard. Pulmonary:     Effort: Pulmonary effort is normal. No respiratory distress.     Breath sounds: Normal breath sounds.  Abdominal:     General: There is no distension.     Palpations: Abdomen is soft.     Tenderness: There is no abdominal tenderness.  Musculoskeletal:     Cervical back: Normal range of motion and neck supple.     Right lower leg: No edema.     Left lower leg: No edema.  Lymphadenopathy:     Cervical: No cervical adenopathy.  Skin:    General: Skin is warm and dry.  Neurological:     General: No focal deficit present.     Mental Status: She is alert and oriented to person, place, and time.  Psychiatric:        Mood and Affect: Mood normal.        Behavior: Behavior normal.          Assessment & Plan:

## 2021-09-15 NOTE — Patient Instructions (Signed)
Follow up in 3-4 months to recheck sugars We'll notify you of your lab results and make any changes if needed Continue to work on low carb/low sugar diet- you can do it!!! Try and get regular exercise! Call with any questions or concerns Stay Safe!  Stay Healthy! Enjoy the rest of your summer!!!

## 2021-09-16 ENCOUNTER — Other Ambulatory Visit: Payer: Self-pay

## 2021-09-16 ENCOUNTER — Telehealth: Payer: Self-pay

## 2021-09-16 MED ORDER — TRULICITY 3 MG/0.5ML ~~LOC~~ SOAJ: 4.5000 mg | SUBCUTANEOUS | 3 refills | Status: DC

## 2021-09-16 NOTE — Telephone Encounter (Signed)
Caller name: Judeth Cornfield (pharmacist)  On DPR? :yes/no: No  Call back number: [phone]  Provider they see: Dr. Beverely Low  Reason for call: calling to get clarification on trulicity rx.

## 2021-09-16 NOTE — Telephone Encounter (Signed)
-----   Message from Sheliah Hatch, MD sent at 09/16/2021 12:45 PM EDT ----- Labs are stable and look good w/ exception of A1C which as gone back up.  Based on this, we are going to increase your Trulicity to 4.5mg  weekly.  We will send in a new prescription for you to pick up.  Please also try and limit your sugar and carb intake.

## 2021-09-16 NOTE — Telephone Encounter (Signed)
Spoke w/ pt and advised of lab results . Sent in the new Rx of Trulicity to pharmacy

## 2021-09-18 NOTE — Assessment & Plan Note (Signed)
Chronic problem.  Pt was prescribed Crestor but never started the medication.  Check labs and start meds prn.

## 2021-09-18 NOTE — Assessment & Plan Note (Signed)
Chronic problem.  On Trulicity 3mg  weekly and Metformin 1000mg  BID.  UTD on eye exam.  Foot exam done today.  Currently asymptomatic but sugars have not been well controlled recently.  Last A1C 8.2%  Check labs.  Adjust meds prn

## 2021-09-18 NOTE — Assessment & Plan Note (Signed)
Pt is down 3 lbs since last visit.  BMI 40.28.  Stressed need for low carb diet and regular exercise.  Will continue to follow.

## 2021-10-06 ENCOUNTER — Other Ambulatory Visit: Payer: Self-pay | Admitting: Family Medicine

## 2021-10-06 DIAGNOSIS — Z1231 Encounter for screening mammogram for malignant neoplasm of breast: Secondary | ICD-10-CM

## 2021-10-20 ENCOUNTER — Ambulatory Visit
Admission: RE | Admit: 2021-10-20 | Discharge: 2021-10-20 | Disposition: A | Discharge status: Home / Self Care | Payer: BC Managed Care – PPO | Source: Ambulatory Visit | Attending: Family Medicine | Admitting: Family Medicine

## 2021-10-20 DIAGNOSIS — Z1231 Encounter for screening mammogram for malignant neoplasm of breast: Secondary | ICD-10-CM | POA: Diagnosis not present

## 2021-11-03 ENCOUNTER — Telehealth: Payer: Self-pay | Admitting: Family Medicine

## 2021-11-03 NOTE — Telephone Encounter (Signed)
Caller name: Raphael  On DPR? :yes/no: Yes  Call back number: [phone]  Provider they see: Beverely Low  Reason for call: FMLA paperwork will need to be redone b/c pt is driving husband to and from [facility].

## 2021-11-21 NOTE — Telephone Encounter (Signed)
Caller name: Ayria Iran   On DPR? :yes/no: Yes  Call back number: [phone]  Provider they see: Birdie Riddle   Reason for call: Pt called to get a update on her FMLA forms that suppose to be faxed to [phone].

## 2021-11-23 NOTE — Telephone Encounter (Signed)
Forms have been faxed to [employer] for pt

## 2021-11-23 NOTE — Telephone Encounter (Signed)
Forms completed and returned to [facility] 

## 2021-11-28 NOTE — Telephone Encounter (Signed)
Belinda Reilly received the FMLA forms but it was a day late. Pt called to see if we could refax forms again so it can be reviewed.

## 2021-11-28 NOTE — Telephone Encounter (Signed)
Will refax forms again

## 2021-11-29 ENCOUNTER — Other Ambulatory Visit: Payer: Self-pay

## 2021-11-29 MED ORDER — JUNEL FE 1.5/30 1.5-30 MG-MCG PO TABS: 1.0000 | ORAL_TABLET | Freq: Every day | ORAL | 6 refills | Status: DC

## 2021-11-29 MED ORDER — TRULICITY 3 MG/0.5ML ~~LOC~~ SOAJ: 4.5000 mg | SUBCUTANEOUS | 3 refills | Status: DC

## 2021-11-29 NOTE — Telephone Encounter (Signed)
Forms have been refaxed today

## 2021-12-08 ENCOUNTER — Other Ambulatory Visit: Payer: Self-pay

## 2021-12-08 MED ORDER — ALBUTEROL SULFATE HFA 108 (90 BASE) MCG/ACT IN AERS: 2.0000 | INHALATION_SPRAY | RESPIRATORY_TRACT | 0 refills | Status: DC | PRN

## 2021-12-20 ENCOUNTER — Ambulatory Visit (INDEPENDENT_AMBULATORY_CARE_PROVIDER_SITE_OTHER): Payer: BC Managed Care – PPO | Admitting: Family Medicine

## 2021-12-20 ENCOUNTER — Encounter: Payer: Self-pay | Admitting: Family Medicine

## 2021-12-20 VITALS — BP 130/78 | HR 89 | Temp 98.9°F | Resp 16 | Ht 65.5 in | Wt 249.2 lb

## 2021-12-20 DIAGNOSIS — E119 Type 2 diabetes mellitus without complications: Secondary | ICD-10-CM

## 2021-12-20 LAB — BASIC METABOLIC PANEL
BUN: 16 mg/dL (ref 6–23)
CO2: 26 mEq/L (ref 19–32)
Calcium: 9.1 mg/dL (ref 8.4–10.5)
Chloride: 100 mEq/L (ref 96–112)
Creatinine, Ser: 0.8 mg/dL (ref 0.40–1.20)
GFR: 87.76 mL/min (ref >60.00)
Glucose, Bld: 140 mg/dL — ABNORMAL HIGH (ref 70–99)
Potassium: 4 mEq/L (ref 3.5–5.1)
Sodium: 134 mEq/L — ABNORMAL LOW (ref 135–145)

## 2021-12-20 LAB — MICROALBUMIN / CREATININE URINE RATIO
Creatinine,U: 50.6 mg/dL
Microalb Creat Ratio: 2.3 mg/g (ref 0.0–30.0)
Microalb, Ur: 1.2 mg/dL (ref 0.0–1.9)

## 2021-12-20 LAB — HEMOGLOBIN A1C: Hgb A1c MFr Bld: 8.4 % — ABNORMAL HIGH (ref 4.6–6.5)

## 2021-12-20 NOTE — Assessment & Plan Note (Signed)
Ongoing issue.  Recently has had issues w/ control.  At last visit Trulicity was increased to 4.5mg  weekly due to A1C of 8.6%  Continue Metformin 1000mg  BID.  Pt to schedule eye exam.  UTD on foot exam.  microalbumin ordered today.  Check labs.  Adjust meds prn

## 2021-12-20 NOTE — Patient Instructions (Signed)
Schedule your complete physical in 3-4 months We'll notify you of your lab results and make any changes if needed Continue to work on low carb, low sugar diet and regular exercise Call and schedule your eye exam Call with any questions or concerns Happy Fall!!!

## 2021-12-20 NOTE — Progress Notes (Signed)
   Subjective:    Patient ID: Belinda Reilly, female    DOB: 1975-12-12, 46 y.o.   MRN: [id number]  HPI DM- chronic problem.  At last visit we increased Trulicity to 4.5mg  weekly b/c A1C was 8.6%.  UTD on foot exam.  Due for microalbumin and eye exam.  Pt has gained 3 lbs since last visit.  Currently on Metformin 1000mg  BID in addition to Trulicity.  No CP, SOB, HAs, visual changes, abd pain, N/V.  No numbness/tingling of hands/feet.  Denies symptomatic lows.   Review of Systems For ROS see HPI     Objective:   Physical Exam Vitals reviewed.  Constitutional:      General: She is not in acute distress.    Appearance: Normal appearance. She is well-developed. She is obese. She is not ill-appearing.  HENT:     Head: Normocephalic and atraumatic.  Eyes:     Conjunctiva/sclera: Conjunctivae normal.     Pupils: Pupils are equal, round, and reactive to light.  Neck:     Thyroid: No thyromegaly.  Cardiovascular:     Rate and Rhythm: Normal rate and regular rhythm.     Pulses: Normal pulses.     Heart sounds: Normal heart sounds. No murmur heard. Pulmonary:     Effort: Pulmonary effort is normal. No respiratory distress.     Breath sounds: Normal breath sounds.  Abdominal:     General: There is no distension.     Palpations: Abdomen is soft.     Tenderness: There is no abdominal tenderness.  Musculoskeletal:     Cervical back: Normal range of motion and neck supple.     Right lower leg: No edema.     Left lower leg: No edema.  Lymphadenopathy:     Cervical: No cervical adenopathy.  Skin:    General: Skin is warm and dry.  Neurological:     Mental Status: She is alert and oriented to person, place, and time.  Psychiatric:        Behavior: Behavior normal.          Assessment & Plan:

## 2021-12-21 ENCOUNTER — Other Ambulatory Visit: Payer: Self-pay

## 2021-12-21 MED ORDER — EMPAGLIFLOZIN 10 MG PO TABS: 10.0000 mg | ORAL_TABLET | Freq: Every day | ORAL | 3 refills | Status: DC

## 2021-12-21 NOTE — Progress Notes (Signed)
Informed pt of lab results and sent Jardiance 10 mg to the pharmacy as well

## 2022-03-24 LAB — HM DIABETES EYE EXAM

## 2022-03-30 ENCOUNTER — Encounter: Payer: Self-pay | Admitting: Family Medicine

## 2022-03-30 ENCOUNTER — Telehealth: Payer: Self-pay

## 2022-03-30 ENCOUNTER — Ambulatory Visit (INDEPENDENT_AMBULATORY_CARE_PROVIDER_SITE_OTHER): Payer: BC Managed Care – PPO | Admitting: Family Medicine

## 2022-03-30 ENCOUNTER — Other Ambulatory Visit: Payer: Self-pay

## 2022-03-30 VITALS — BP 128/80 | HR 84 | Temp 97.9°F | Resp 17 | Ht 65.0 in | Wt 247.4 lb

## 2022-03-30 DIAGNOSIS — R7989 Other specified abnormal findings of blood chemistry: Secondary | ICD-10-CM

## 2022-03-30 DIAGNOSIS — Z0000 Encounter for general adult medical examination without abnormal findings: Secondary | ICD-10-CM | POA: Diagnosis not present

## 2022-03-30 DIAGNOSIS — E1169 Type 2 diabetes mellitus with other specified complication: Secondary | ICD-10-CM

## 2022-03-30 DIAGNOSIS — E785 Hyperlipidemia, unspecified: Secondary | ICD-10-CM

## 2022-03-30 LAB — LIPID PANEL
Cholesterol: 195 mg/dL (ref 0–200)
HDL: 45.3 mg/dL (ref >39.00)
NonHDL: 149.42
Total CHOL/HDL Ratio: 4
Triglycerides: 221 mg/dL — ABNORMAL HIGH (ref 0.0–149.0)
VLDL: 44.2 mg/dL — ABNORMAL HIGH (ref 0.0–40.0)

## 2022-03-30 LAB — HEPATIC FUNCTION PANEL
ALT: 9 U/L (ref 0–35)
AST: 12 U/L (ref 0–37)
Albumin: 3.9 g/dL (ref 3.5–5.2)
Alkaline Phosphatase: 52 U/L (ref 39–117)
Bilirubin, Direct: 0.2 mg/dL (ref 0.0–0.3)
Total Bilirubin: 1 mg/dL (ref 0.2–1.2)
Total Protein: 7.5 g/dL (ref 6.0–8.3)

## 2022-03-30 LAB — CBC WITH DIFFERENTIAL/PLATELET
Basophils Absolute: 0.1 10*3/uL (ref 0.0–0.1)
Basophils Relative: 1 % (ref 0.0–3.0)
Eosinophils Absolute: 0.2 10*3/uL (ref 0.0–0.7)
Eosinophils Relative: 1.7 % (ref 0.0–5.0)
HCT: 35.4 % — ABNORMAL LOW (ref 36.0–46.0)
Hemoglobin: 11.9 g/dL — ABNORMAL LOW (ref 12.0–15.0)
Lymphocytes Relative: 30.8 % (ref 12.0–46.0)
Lymphs Abs: 3.4 10*3/uL (ref 0.7–4.0)
MCHC: 33.6 g/dL (ref 30.0–36.0)
MCV: 79.7 fl (ref 78.0–100.0)
Monocytes Absolute: 0.6 10*3/uL (ref 0.1–1.0)
Monocytes Relative: 5.4 % (ref 3.0–12.0)
Neutro Abs: 6.8 10*3/uL (ref 1.4–7.7)
Neutrophils Relative %: 61.1 % (ref 43.0–77.0)
Platelets: 341 10*3/uL (ref 150.0–400.0)
RBC: 4.44 Mil/uL (ref 3.87–5.11)
RDW: 14.9 % (ref 11.5–15.5)
WBC: 11.1 10*3/uL — ABNORMAL HIGH (ref 4.0–10.5)

## 2022-03-30 LAB — VITAMIN D 25 HYDROXY (VIT D DEFICIENCY, FRACTURES): VITD: 11.56 ng/mL — ABNORMAL LOW (ref 30.00–100.00)

## 2022-03-30 LAB — BASIC METABOLIC PANEL
BUN: 18 mg/dL (ref 6–23)
CO2: 26 mEq/L (ref 19–32)
Calcium: 9.2 mg/dL (ref 8.4–10.5)
Chloride: 99 mEq/L (ref 96–112)
Creatinine, Ser: 0.81 mg/dL (ref 0.40–1.20)
GFR: 86.29 mL/min (ref >60.00)
Glucose, Bld: 192 mg/dL — ABNORMAL HIGH (ref 70–99)
Potassium: 4.2 mEq/L (ref 3.5–5.1)
Sodium: 134 mEq/L — ABNORMAL LOW (ref 135–145)

## 2022-03-30 LAB — LDL CHOLESTEROL, DIRECT: Direct LDL: 116 mg/dL

## 2022-03-30 LAB — HEMOGLOBIN A1C: Hgb A1c MFr Bld: 8.2 % — ABNORMAL HIGH (ref 4.6–6.5)

## 2022-03-30 LAB — TSH: TSH: 1.93 u[IU]/mL (ref 0.35–5.50)

## 2022-03-30 MED ORDER — VITAMIN D (ERGOCALCIFEROL) 1.25 MG (50000 UNIT) PO CAPS: 50000.0000 [IU] | ORAL_CAPSULE | ORAL | 12 refills | Status: DC

## 2022-03-30 MED ORDER — ROSUVASTATIN CALCIUM 10 MG PO TABS: 10.0000 mg | ORAL_TABLET | Freq: Every day | ORAL | 3 refills | Status: DC

## 2022-03-30 NOTE — Patient Instructions (Signed)
Follow up in 3-4 months to recheck sugar We'll notify you of your lab results and make any changes if needed Continue to work on healthy diet and regular exercise- you can do it! Call with any questions or concerns Stay Safe!  Stay Healthy! 

## 2022-03-30 NOTE — Assessment & Plan Note (Signed)
Ongoing issue for pt.  She reports that she eats pretty well but does not get any regular exercise.  Stressed need for regular physical activity as well as following a diabetic diet.  Will continue to follow.

## 2022-03-30 NOTE — Assessment & Plan Note (Signed)
Pt's PE WNL w/ exception of BMI.  UTD on pap, mammo, colonoscopy, Tdap.  Check lab.  Anticipatory guidance provided.

## 2022-03-30 NOTE — Telephone Encounter (Signed)
Informed pt of lab results and sent in Crestor 10 mg and Vit d 50,000 unit to pharmacy  . Liver function order is in and lab only visit has been made

## 2022-03-30 NOTE — Progress Notes (Signed)
   Subjective:    Patient ID: Belinda Reilly, female    DOB: Jan 18, 1976, 47 y.o.   MRN: [id number]  HPI CPE- UTD on foot exam, microalbumin, eye exam, pap, mammo, Tdap, colonoscopy.  Pt has been out of Trulicity since December- back order  Patient Care Team    Relationship Specialty Notifications Start End  Midge Minium, MD PCP - General Family Medicine  12/18/12     Health Maintenance  Topic Date Due   INFLUENZA VACCINE  05/21/2022 (Originally 09/20/2021)   Hepatitis C Screening  03/31/2023 (Originally 07/29/1992)   HEMOGLOBIN A1C  06/20/2022   FOOT EXAM  09/16/2022   Diabetic kidney evaluation - eGFR measurement  12/21/2022   Diabetic kidney evaluation - Urine ACR  12/21/2022   OPHTHALMOLOGY EXAM  03/25/2023   PAP SMEAR-Modifier  07/22/2023   DTaP/Tdap/Td (2 - Td or Tdap) 02/25/2029   COLONOSCOPY (Pts 45-51yrs Insurance coverage will need to be confirmed)  03/17/2030   HIV Screening  Completed   HPV VACCINES  Aged Out   COVID-19 Vaccine  Discontinued      Review of Systems Patient reports no vision/ hearing changes, adenopathy,fever, weight change,  persistant/recurrent hoarseness , swallowing issues, chest pain, palpitations, edema, persistant/recurrent cough, hemoptysis, dyspnea (rest/exertional/paroxysmal nocturnal), gastrointestinal bleeding (melena, rectal bleeding), abdominal pain, significant heartburn, bowel changes, GU symptoms (dysuria, hematuria, incontinence), Gyn symptoms (abnormal  bleeding, pain),  syncope, focal weakness, memory loss, numbness & tingling, skin/hair/nail changes, abnormal bruising or bleeding, anxiety, or depression.     Objective:   Physical Exam General Appearance:    Alert, cooperative, no distress, appears stated age, obese  Head:    Normocephalic, without obvious abnormality, atraumatic  Eyes:    PERRL, conjunctiva/corneas clear, EOM's intact both eyes  Ears:    Normal TM's and external ear canals, both ears  Nose:   Nares normal, septum  midline, mucosa normal, no drainage    or sinus tenderness  Throat:   Lips, mucosa, and tongue normal; teeth and gums normal  Neck:   Supple, symmetrical, trachea midline, no adenopathy;    Thyroid: no enlargement/tenderness/nodules  Back:     Symmetric, no curvature, ROM normal, no CVA tenderness  Lungs:     Clear to auscultation bilaterally, respirations unlabored  Chest Wall:    No tenderness or deformity   Heart:    Regular rate and rhythm, S1 and S2 normal, no murmur, rub   or gallop  Breast Exam:    Deferred to mammo  Abdomen:     Soft, non-tender, bowel sounds active all four quadrants,    no masses, no organomegaly  Genitalia:    Deferred  Rectal:    Extremities:   Extremities normal, atraumatic, no cyanosis or edema  Pulses:   2+ and symmetric all extremities  Skin:   Skin color, texture, turgor normal, no rashes or lesions  Lymph nodes:   Cervical, supraclavicular, and axillary nodes normal  Neurologic:   CNII-XII intact, normal strength, sensation and reflexes    throughout          Assessment & Plan:

## 2022-03-30 NOTE — Telephone Encounter (Signed)
-----   Message from Midge Minium, MD sent at 03/30/2022  3:33 PM EST ----- Vit D remains quite low.  Based on this, we need to start 50,000 units weekly x12 weeks in addition to daily OTC supplement of at least 2000 units.   Your A1C is better at 8.2%  This is great news!  Continue to work on low carb, low sugar diet and get back to regular physical activity to get this number under 8%  You can do it!!!  Total cholesterol and LDL (bad cholesterol) have both jumped.  Based on this, we need to start a daily cholesterol medication to get your numbers closer to goal.  Start Crestor 10mg  nightly and repeat liver functions at a lab only visit in 6-8 weeks (dx hyperlipidemia)  Remainder of labs are stable and look good!

## 2022-03-30 NOTE — Assessment & Plan Note (Signed)
Chronic problem.  Attempting to control w/ diet and exercise.  Check labs and determine if statin is needed.

## 2022-05-01 ENCOUNTER — Encounter: Payer: Self-pay | Admitting: Family Medicine

## 2022-05-01 ENCOUNTER — Ambulatory Visit (INDEPENDENT_AMBULATORY_CARE_PROVIDER_SITE_OTHER): Payer: BC Managed Care – PPO | Admitting: Family Medicine

## 2022-05-01 VITALS — BP 122/78 | HR 83 | Temp 98.0°F | Ht 65.0 in | Wt 247.4 lb

## 2022-05-01 DIAGNOSIS — R7989 Other specified abnormal findings of blood chemistry: Secondary | ICD-10-CM | POA: Diagnosis not present

## 2022-05-01 DIAGNOSIS — R519 Headache, unspecified: Secondary | ICD-10-CM | POA: Diagnosis not present

## 2022-05-01 LAB — POC COVID19 BINAXNOW: SARS Coronavirus 2 Ag: NEGATIVE

## 2022-05-01 NOTE — Patient Instructions (Addendum)
-  Rapid covid performed.  -Ordered labs for a reason of headaches. Office will call with results and you can see them on MyChart.  -Recommend over the counter Vitamin D3 5,000IU gummies, 1 tablet once a day since you are unable to tolerate capsule. Continue with also taking over the counter 2,000IU of what you already have.  -Encourage to drink 64-100oz of fluids a day, preferably water.  -Recommend to alternate Tylenol 1000mg  and Ibuprofen 800mg  every 4 hours for headache as needed. Eat something with taking Ibuprofen. Also, can try Excedrin Migraine.

## 2022-05-01 NOTE — Progress Notes (Signed)
Acute Office Visit   Subjective:  Patient ID: Belinda Reilly, female    DOB: [birth year], 47 y.o.   MRN: [id number]  Chief Complaint  Patient presents with   Headache    Pt is here to discuss headaches.Pt states she has headaches 3-4 daily.  Location on rt side. Pt states she has started a new medication (Crestor). Pt does not have a hx of migraines.  Pt states she has a dry cough for awhile (nothing new). Pt would like to know if she can take OTC Vit.d, she can not take the gel vitamin d 50,000.    Headache    Patient is a 47 year old female that is complaining of new onset headaches. Patient reports she started experiencing headaches about 4 days ago. She reports headaches are intermittent, usually lasting for hours. Notices after eating. Pain is located right occipital and right parietal region. Described as throbbing pain.She denies dizziness or blurry vision with experiencing headaches. She is aware that she needs to change her eye glass prescription and has blurry vision with her glasses looking at the computer screen. She reports she has been taking Goody Powder and Tylenol, it helps little bit. Eventually it resolves. She reports she does not wake up with headaches. No photosensitivity.   She reports she drinks 3 bottles of water (16.9), and 1 bottle of soda a day.  Reports a chronic dry cough;   Denies sinus pain or pressure, fever, sore throat, or ear pressure/pain.   Review of Systems  Neurological:  Positive for headaches.   See HPI above      Objective:    BP 122/78   Pulse 83   Temp 98 F (36.7 C)   Ht '5\' 5"'$  (1.651 m)   Wt 247 lb 6 oz (112.2 kg)   SpO2 99%   BMI 41.17 kg/m    Physical Exam Vitals reviewed.  Constitutional:      General: She is not in acute distress.    Appearance: Normal appearance. She is well-developed. She is obese. She is not ill-appearing, toxic-appearing or diaphoretic.  HENT:     Head: Normocephalic and atraumatic.  Eyes:      General: No scleral icterus.       Right eye: No discharge.        Left eye: No discharge.     Extraocular Movements: Extraocular movements intact.     Right eye: No nystagmus.     Left eye: No nystagmus.     Conjunctiva/sclera: Conjunctivae normal.     Pupils: Pupils are equal, round, and reactive to light. Pupils are equal.  Cardiovascular:     Rate and Rhythm: Normal rate and regular rhythm.     Heart sounds: Normal heart sounds. No murmur heard.    No friction rub. No gallop.  Pulmonary:     Effort: Pulmonary effort is normal. No respiratory distress.     Breath sounds: Normal breath sounds.  Musculoskeletal:        General: Normal range of motion.  Skin:    General: Skin is warm and dry.  Neurological:     General: No focal deficit present.     Mental Status: She is alert and oriented to person, place, and time. Mental status is at baseline.     GCS: GCS eye subscore is 4. GCS verbal subscore is 5. GCS motor subscore is 6.     Cranial Nerves: No cranial nerve deficit.     Motor: No  weakness.     Coordination: Coordination normal.  Psychiatric:        Mood and Affect: Mood normal.        Speech: Speech normal.        Behavior: Behavior normal.        Thought Content: Thought content normal.        Judgment: Judgment normal.    Results for orders placed or performed in visit on 05/01/22  POC COVID-19  Result Value Ref Range   SARS Coronavirus 2 Ag Negative Negative      Assessment & Plan:  New onset of headaches -     POC COVID-19 BinaxNow -     Magnesium -     CBC with Differential/Platelet -     Sedimentation rate -     Basic metabolic panel  Low vitamin D level  -Rapid covid is negative.  -Ordered labs for a reason of headaches. Office will call with results and you can see them on MyChart.  -Recommend over the counter Vitamin D3 5,000IU gummies, 1 tablet once a day since you are unable to tolerate capsule. Continue with also taking over the counter 2,000IU  of what you already have.  -Encourage to drink 64-100oz of fluids a day, preferably water.  -Recommend to alternate Tylenol '1000mg'$  and Ibuprofen '800mg'$  every 4 hours for headache as needed. Eat something with taking Ibuprofen. Also, can try Excedrin Migraine.  -Provided patient information about general headache.  -Patient agreed with plan of care.   No follow-ups on file.  Valarie Merino, NP

## 2022-05-02 ENCOUNTER — Ambulatory Visit: Payer: BC Managed Care – PPO | Admitting: Family Medicine

## 2022-05-02 ENCOUNTER — Other Ambulatory Visit (INDEPENDENT_AMBULATORY_CARE_PROVIDER_SITE_OTHER): Payer: BC Managed Care – PPO

## 2022-05-02 ENCOUNTER — Other Ambulatory Visit: Payer: Self-pay

## 2022-05-02 DIAGNOSIS — E785 Hyperlipidemia, unspecified: Secondary | ICD-10-CM

## 2022-05-02 DIAGNOSIS — R519 Headache, unspecified: Secondary | ICD-10-CM

## 2022-05-02 DIAGNOSIS — E1169 Type 2 diabetes mellitus with other specified complication: Secondary | ICD-10-CM | POA: Diagnosis not present

## 2022-05-02 LAB — CBC WITH DIFFERENTIAL/PLATELET
Basophils Absolute: 0.1 10*3/uL (ref 0.0–0.1)
Basophils Relative: 0.8 % (ref 0.0–3.0)
Eosinophils Absolute: 0.2 10*3/uL (ref 0.0–0.7)
Eosinophils Relative: 1.7 % (ref 0.0–5.0)
HCT: 35.9 % — ABNORMAL LOW (ref 36.0–46.0)
Hemoglobin: 12.1 g/dL (ref 12.0–15.0)
Lymphocytes Relative: 30.3 % (ref 12.0–46.0)
Lymphs Abs: 3.3 10*3/uL (ref 0.7–4.0)
MCHC: 33.7 g/dL (ref 30.0–36.0)
MCV: 78.8 fl (ref 78.0–100.0)
Monocytes Absolute: 0.6 10*3/uL (ref 0.1–1.0)
Monocytes Relative: 5.2 % (ref 3.0–12.0)
Neutro Abs: 6.7 10*3/uL (ref 1.4–7.7)
Neutrophils Relative %: 62 % (ref 43.0–77.0)
Platelets: 323 10*3/uL (ref 150.0–400.0)
RBC: 4.56 Mil/uL (ref 3.87–5.11)
RDW: 14.7 % (ref 11.5–15.5)
WBC: 10.9 10*3/uL — ABNORMAL HIGH (ref 4.0–10.5)

## 2022-05-02 LAB — BASIC METABOLIC PANEL
BUN: 17 mg/dL (ref 6–23)
CO2: 27 mEq/L (ref 19–32)
Calcium: 9.3 mg/dL (ref 8.4–10.5)
Chloride: 98 mEq/L (ref 96–112)
Creatinine, Ser: 0.79 mg/dL (ref 0.40–1.20)
GFR: 88.87 mL/min (ref >60.00)
Glucose, Bld: 123 mg/dL — ABNORMAL HIGH (ref 70–99)
Potassium: 4.2 mEq/L (ref 3.5–5.1)
Sodium: 134 mEq/L — ABNORMAL LOW (ref 135–145)

## 2022-05-02 LAB — SEDIMENTATION RATE: Sed Rate: 31 mm/hr — ABNORMAL HIGH (ref 0–20)

## 2022-05-02 LAB — HEPATIC FUNCTION PANEL
ALT: 9 U/L (ref 0–35)
AST: 10 U/L (ref 0–37)
Albumin: 3.6 g/dL (ref 3.5–5.2)
Alkaline Phosphatase: 55 U/L (ref 39–117)
Bilirubin, Direct: 0.2 mg/dL (ref 0.0–0.3)
Total Bilirubin: 1.2 mg/dL (ref 0.2–1.2)
Total Protein: 6.9 g/dL (ref 6.0–8.3)

## 2022-05-02 LAB — MAGNESIUM: Magnesium: 1.2 mg/dL — ABNORMAL LOW (ref 1.5–2.5)

## 2022-05-02 MED ORDER — MAGNESIUM OXIDE -MG SUPPLEMENT 400 (240 MG) MG PO TABS: 400.0000 mg | ORAL_TABLET | Freq: Every day | ORAL | Status: DC

## 2022-05-02 NOTE — Progress Notes (Signed)
Patient has been made aware of lab results.    [facility]  [phone]

## 2022-05-03 ENCOUNTER — Telehealth: Payer: Self-pay

## 2022-05-03 NOTE — Telephone Encounter (Signed)
Informed pt of lab result 

## 2022-05-03 NOTE — Telephone Encounter (Signed)
-----   Message from Midge Minium, MD sent at 05/03/2022  7:22 AM EDT ----- Liver functions look great!  No changes at this time

## 2022-05-04 ENCOUNTER — Other Ambulatory Visit: Payer: BC Managed Care – PPO

## 2022-05-09 ENCOUNTER — Telehealth: Payer: Self-pay | Admitting: Family Medicine

## 2022-05-09 NOTE — Telephone Encounter (Signed)
Encourage patient to contact the pharmacy for refills or they can request refills through [facility]  (Please schedule appointment if patient has not been seen in over a year)    WHAT [place] THIS SENT TO:  [facility] [place], [place] - [address] AT [address]: magnesium oxide  4000 mg  NOTES/COMMENTS FROM PATIENT: patient states that her pharmacy hasn't received her medication.       [place] office please notify patient: It takes 48-72 hours to process rx refill requests Ask patient to call pharmacy to ensure rx is ready before heading there.

## 2022-05-10 ENCOUNTER — Other Ambulatory Visit: Payer: Self-pay

## 2022-05-10 ENCOUNTER — Other Ambulatory Visit: Payer: Self-pay | Admitting: Family Medicine

## 2022-05-10 MED ORDER — MAGNESIUM OXIDE 400 MG PO CAPS: 1.0000 | ORAL_CAPSULE | Freq: Every day | ORAL | 1 refills | Status: DC

## 2022-05-10 NOTE — Telephone Encounter (Signed)
Left pt a VM stating the Rx was sent in

## 2022-05-10 NOTE — Telephone Encounter (Signed)
Good morning , Pt is calling and stating that her Rx for Magnesium Oxide was never sent to pharmacy. I looked in her med list and it is listed as a clinic given medication . Can you send in a Rx ?

## 2022-05-22 ENCOUNTER — Telehealth: Payer: Self-pay | Admitting: Family Medicine

## 2022-05-22 ENCOUNTER — Other Ambulatory Visit: Payer: Self-pay

## 2022-05-22 MED ORDER — JUNEL FE 1.5/30 1.5-30 MG-MCG PO TABS: 1.0000 | ORAL_TABLET | Freq: Every day | ORAL | 6 refills | Status: DC

## 2022-05-22 NOTE — Telephone Encounter (Signed)
Rx has been sent in. 

## 2022-05-22 NOTE — Telephone Encounter (Signed)
Encourage patient to contact the pharmacy for refills or they can request refills through [facility] TO:   [place], [address] AT [place]: JUNEL FE 1.5/30 1.5-30 MG-MCG tablet   NOTES/COMMENTS FROM PATIENT:Pt states she stopped taking but is cramping now and would like to restart taking medication.      [place] office please notify patient: It takes 48-72 hours to process rx refill requests Ask patient to call pharmacy to ensure rx is ready before heading there.

## 2022-06-20 ENCOUNTER — Other Ambulatory Visit: Payer: Self-pay

## 2022-06-20 MED ORDER — METFORMIN HCL 1000 MG PO TABS: ORAL_TABLET | ORAL | 1 refills | Status: DC

## 2022-06-28 ENCOUNTER — Ambulatory Visit (INDEPENDENT_AMBULATORY_CARE_PROVIDER_SITE_OTHER): Payer: BC Managed Care – PPO | Admitting: Family Medicine

## 2022-06-28 VITALS — BP 130/78 | HR 82 | Temp 98.0°F | Resp 17 | Ht 65.0 in | Wt 248.0 lb

## 2022-06-28 DIAGNOSIS — E119 Type 2 diabetes mellitus without complications: Secondary | ICD-10-CM | POA: Diagnosis not present

## 2022-06-28 DIAGNOSIS — Z7984 Long term (current) use of oral hypoglycemic drugs: Secondary | ICD-10-CM

## 2022-06-28 LAB — BASIC METABOLIC PANEL
BUN: 16 mg/dL (ref 6–23)
CO2: 27 mEq/L (ref 19–32)
Calcium: 9.2 mg/dL (ref 8.4–10.5)
Chloride: 98 mEq/L (ref 96–112)
Creatinine, Ser: 0.84 mg/dL (ref 0.40–1.20)
GFR: 82.47 mL/min (ref >60.00)
Glucose, Bld: 260 mg/dL — ABNORMAL HIGH (ref 70–99)
Potassium: 4.3 mEq/L (ref 3.5–5.1)
Sodium: 132 mEq/L — ABNORMAL LOW (ref 135–145)

## 2022-06-28 LAB — HEMOGLOBIN A1C: Hgb A1c MFr Bld: 9 % — ABNORMAL HIGH (ref 4.6–6.5)

## 2022-06-28 MED ORDER — EMPAGLIFLOZIN 10 MG PO TABS: 10.0000 mg | ORAL_TABLET | Freq: Every day | ORAL | 3 refills | Status: DC

## 2022-06-28 NOTE — Progress Notes (Signed)
   Subjective:    Patient ID: Belinda Reilly, female    DOB: 01-04-1976, 47 y.o.   MRN: [id number]  HPI DM- chronic problem, on Trulicity 3mg  weekly (has had supply issues x2 months), Jardiance 10mg  daily (not taking due to high cost until deductible met), Metformin 1000mg  BID.  UTD on eye exam, foot exam, microalbumin.  Last A1C 8.2%  No CP, SOB, HA's, visual changes, abd pain, N/V.  Mild intermittent numbness of feet.   Review of Systems For ROS see HPI     Objective:   Physical Exam Vitals reviewed.  Constitutional:      General: She is not in acute distress.    Appearance: Normal appearance. She is well-developed. She is obese. She is not ill-appearing.  HENT:     Head: Normocephalic and atraumatic.  Eyes:     Conjunctiva/sclera: Conjunctivae normal.     Pupils: Pupils are equal, round, and reactive to light.  Neck:     Thyroid: No thyromegaly.  Cardiovascular:     Rate and Rhythm: Normal rate and regular rhythm.     Pulses: Normal pulses.     Heart sounds: Normal heart sounds. No murmur heard. Pulmonary:     Effort: Pulmonary effort is normal. No respiratory distress.     Breath sounds: Normal breath sounds.  Abdominal:     General: There is no distension.     Palpations: Abdomen is soft.     Tenderness: There is no abdominal tenderness.  Musculoskeletal:     Cervical back: Normal range of motion and neck supple.     Right lower leg: No edema.     Left lower leg: No edema.  Lymphadenopathy:     Cervical: No cervical adenopathy.  Skin:    General: Skin is warm and dry.  Neurological:     General: No focal deficit present.     Mental Status: She is alert and oriented to person, place, and time.  Psychiatric:        Mood and Affect: Mood normal.        Behavior: Behavior normal.           Assessment & Plan:

## 2022-06-28 NOTE — Assessment & Plan Note (Signed)
Chronic problem.  Unfortunately pt did not notify our office that she has not been able to get her Trulicity or that the Jardiance was too expensive.  She has only been taking the Metformin.  UTD on foot exam, eye exam, microalbumin.  Last A1C 8.2%  Encouraged low carb diet, regular exercise, and we will attempt to start the Jardiance now that she has met her deductible

## 2022-06-28 NOTE — Patient Instructions (Addendum)
Follow up in 3-4 months to recheck BP, cholesterol, and sugar We'll notify you of your lab results and make any changes if needed Continue to work on low carb/low sugar diet and get regular exercise- you can do it!! START the Jardiance Let me know if we need to send the Trulicity somewhere else (that has it in stock) Call with any questions or concerns Have a great summer!!!

## 2022-06-29 ENCOUNTER — Telehealth: Payer: Self-pay

## 2022-06-29 NOTE — Telephone Encounter (Signed)
-----   Message from Sheliah Hatch, MD sent at 06/29/2022  7:42 AM EDT ----- A1C has increased from 8.2 --> 9%  This indicates poor sugar control.  You need to start the Jardiance 10mg  daily in addition to your Metformin while working on low carb/low sugar diet and regular physical activity.  We want to get this under better control to avoid any long term complications

## 2022-06-29 NOTE — Telephone Encounter (Signed)
Left results on pt VM  

## 2022-07-05 ENCOUNTER — Other Ambulatory Visit: Payer: Self-pay

## 2022-07-05 ENCOUNTER — Telehealth: Payer: Self-pay | Admitting: Family Medicine

## 2022-07-05 MED ORDER — TRULICITY 3 MG/0.5ML ~~LOC~~ SOAJ: 4.5000 mg | SUBCUTANEOUS | 3 refills | Status: DC

## 2022-07-05 MED ORDER — EMPAGLIFLOZIN 10 MG PO TABS: 10.0000 mg | ORAL_TABLET | Freq: Every day | ORAL | 3 refills | Status: DC

## 2022-07-05 NOTE — Telephone Encounter (Signed)
Spoke to pt and she wanted these Rx sent to [facility] on [address] . We have sent those in for her

## 2022-07-05 NOTE — Telephone Encounter (Signed)
Encourage patient to contact the pharmacy for refills or they can request refills through [facility]   WHAT PHARMACY WOULD THEY LIKE THIS SENT TO:   MEDICATION NAME & DOSE: Dulaglutide (TRULICITY) 3 MG/0.5ML SOPN empagliflozin (JARDIANCE) 10 MG TABS tablet  NOTES/COMMENTS FROM PATIENT: Pt is going pharmacy shopping for a new pharmacy that may both of these. Pt would a call from [facility].      Front office please notify patient: It takes 48-72 hours to process rx refill requests Ask patient to call pharmacy to ensure rx is ready before heading there.

## 2022-07-10 ENCOUNTER — Telehealth: Payer: Self-pay | Admitting: Family Medicine

## 2022-07-10 MED ORDER — TRULICITY 4.5 MG/0.5ML ~~LOC~~ SOAJ: 4.5000 mg | SUBCUTANEOUS | 0 refills | Status: DC

## 2022-07-10 NOTE — Telephone Encounter (Signed)
Pt is stating we increased the Trulicity dosage to 4 mg I do not see this can you double check ? If we did she would like it sent to [facility] [place]

## 2022-07-10 NOTE — Telephone Encounter (Signed)
Encourage patient to contact the pharmacy for refills or they can request refills through [facility]  WHAT PHARMACY WOULD THEY LIKE THIS SENT TO:  [id number] - [place] - [address] AT [facility] OF [address]   MEDICATION NAME & DOSE: Dulaglutide (TRULICITY) 3 MG/0.5ML SOPN    NOTES/COMMENTS FROM PATIENT: Pt called stating dosing is incorrect for Trulicity. Patient states pharmacy says 4.1??? -Advise       Front office please notify patient: It takes 48-72 hours to process rx refill requests Ask patient to call pharmacy to ensure rx is ready before heading there.

## 2022-07-10 NOTE — Telephone Encounter (Signed)
We had not talked about increasing the Trulicity but per my last lab note, she is to restart the Jardiance 10mg  daily

## 2022-07-10 NOTE — Telephone Encounter (Signed)
I HAVE notified the pt

## 2022-07-10 NOTE — Telephone Encounter (Signed)
I have spoke to the  pt and she states  the instructions is to inject 4.5 mg weekly but the Rx is for the  3mg  pharmacy is wanting [place] to clarify the Rx ? And the jardiacne is too expensive is there alternative ?

## 2022-07-10 NOTE — Telephone Encounter (Signed)
New prescription for increased dose of Trulicity sent to pharmacy (4.5mg ).  Since Belinda Reilly is too expensive, will hold on that for now.  But needs to be VERY mindful of her carb and sugar intake and continue to work on regular physical activity/exercise

## 2022-07-30 ENCOUNTER — Other Ambulatory Visit: Payer: Self-pay | Admitting: Family Medicine

## 2022-07-30 DIAGNOSIS — E1169 Type 2 diabetes mellitus with other specified complication: Secondary | ICD-10-CM

## 2022-08-08 ENCOUNTER — Telehealth: Payer: Self-pay

## 2022-08-08 NOTE — Telephone Encounter (Signed)
Pt called and states she is not able to get the Trulicity 4.5 mg . [facility] has the 3 mg can we send in Rx ?

## 2022-08-09 ENCOUNTER — Other Ambulatory Visit: Payer: Self-pay

## 2022-08-09 DIAGNOSIS — E119 Type 2 diabetes mellitus without complications: Secondary | ICD-10-CM

## 2022-08-09 MED ORDER — TRULICITY 3 MG/0.5ML ~~LOC~~ SOAJ
3.0000 mg | SUBCUTANEOUS | 0 refills | Status: DC
Start: 2022-08-09 — End: 2022-10-02

## 2022-08-09 NOTE — Telephone Encounter (Signed)
Ok to send in the Trulicity 3mg  weekly to preferred [facility]

## 2022-08-09 NOTE — Telephone Encounter (Signed)
I have notified the pt that we sent in the Trulicity to [facility]

## 2022-08-09 NOTE — Telephone Encounter (Signed)
Trulicity 3 mg sent in to [facility] on [address].

## 2022-08-13 ENCOUNTER — Other Ambulatory Visit: Payer: Self-pay | Admitting: Family Medicine

## 2022-08-14 ENCOUNTER — Telehealth: Payer: Self-pay

## 2022-08-14 ENCOUNTER — Other Ambulatory Visit (HOSPITAL_COMMUNITY): Payer: Self-pay

## 2022-08-14 NOTE — Telephone Encounter (Signed)
Patient Advocate Encounter  Prior authorization for Trulicity 4.5MG /0.5ML pen-injectors submitted and APPROVED through [facility].  Test billing returns $25.00 copay for 28 day supply.  Key [id number] Effective: 08-14-2022 - 08-14-2023

## 2022-08-15 NOTE — Telephone Encounter (Signed)
Left pt  a VM stating information

## 2022-10-02 ENCOUNTER — Ambulatory Visit (INDEPENDENT_AMBULATORY_CARE_PROVIDER_SITE_OTHER): Payer: BC Managed Care – PPO | Admitting: Family Medicine

## 2022-10-02 ENCOUNTER — Encounter: Payer: Self-pay | Admitting: Family Medicine

## 2022-10-02 VITALS — BP 136/80 | HR 83 | Temp 98.2°F | Resp 18 | Ht 65.0 in | Wt 250.1 lb

## 2022-10-02 DIAGNOSIS — Z7984 Long term (current) use of oral hypoglycemic drugs: Secondary | ICD-10-CM

## 2022-10-02 DIAGNOSIS — E1169 Type 2 diabetes mellitus with other specified complication: Secondary | ICD-10-CM

## 2022-10-02 DIAGNOSIS — E785 Hyperlipidemia, unspecified: Secondary | ICD-10-CM

## 2022-10-02 DIAGNOSIS — Z7985 Long-term (current) use of injectable non-insulin antidiabetic drugs: Secondary | ICD-10-CM

## 2022-10-02 DIAGNOSIS — E119 Type 2 diabetes mellitus without complications: Secondary | ICD-10-CM

## 2022-10-02 DIAGNOSIS — Z6841 Body Mass Index (BMI) 40.0 and over, adult: Secondary | ICD-10-CM

## 2022-10-02 LAB — BASIC METABOLIC PANEL
BUN: 18 mg/dL (ref 6–23)
CO2: 23 mEq/L (ref 19–32)
Calcium: 9.3 mg/dL (ref 8.4–10.5)
Chloride: 98 mEq/L (ref 96–112)
Creatinine, Ser: 0.9 mg/dL (ref 0.40–1.20)
GFR: 75.77 mL/min (ref >60.00)
Glucose, Bld: 249 mg/dL — ABNORMAL HIGH (ref 70–99)
Potassium: 4 mEq/L (ref 3.5–5.1)
Sodium: 131 mEq/L — ABNORMAL LOW (ref 135–145)

## 2022-10-02 LAB — CBC WITH DIFFERENTIAL/PLATELET
Basophils Absolute: 0 10*3/uL (ref 0.0–0.1)
Basophils Relative: 0.5 % (ref 0.0–3.0)
Eosinophils Absolute: 0.2 10*3/uL (ref 0.0–0.7)
Eosinophils Relative: 1.5 % (ref 0.0–5.0)
HCT: 35.9 % — ABNORMAL LOW (ref 36.0–46.0)
Hemoglobin: 11.7 g/dL — ABNORMAL LOW (ref 12.0–15.0)
Lymphocytes Relative: 34.5 % (ref 12.0–46.0)
Lymphs Abs: 3.6 10*3/uL (ref 0.7–4.0)
MCHC: 32.7 g/dL (ref 30.0–36.0)
MCV: 79.7 fl (ref 78.0–100.0)
Monocytes Absolute: 0.4 10*3/uL (ref 0.1–1.0)
Monocytes Relative: 3.9 % (ref 3.0–12.0)
Neutro Abs: 6.1 10*3/uL (ref 1.4–7.7)
Neutrophils Relative %: 59.6 % (ref 43.0–77.0)
Platelets: 339 10*3/uL (ref 150.0–400.0)
RBC: 4.51 Mil/uL (ref 3.87–5.11)
RDW: 14.9 % (ref 11.5–15.5)
WBC: 10.3 10*3/uL (ref 4.0–10.5)

## 2022-10-02 LAB — LIPID PANEL
Cholesterol: 112 mg/dL (ref 0–200)
HDL: 46.2 mg/dL (ref >39.00)
LDL Cholesterol: 28 mg/dL (ref 0–99)
NonHDL: 66.23
Total CHOL/HDL Ratio: 2
Triglycerides: 189 mg/dL — ABNORMAL HIGH (ref 0.0–149.0)
VLDL: 37.8 mg/dL (ref 0.0–40.0)

## 2022-10-02 LAB — HEPATIC FUNCTION PANEL
ALT: 9 U/L (ref 0–35)
AST: 12 U/L (ref 0–37)
Albumin: 3.8 g/dL (ref 3.5–5.2)
Alkaline Phosphatase: 43 U/L (ref 39–117)
Bilirubin, Direct: 0.2 mg/dL (ref 0.0–0.3)
Total Bilirubin: 0.8 mg/dL (ref 0.2–1.2)
Total Protein: 7.2 g/dL (ref 6.0–8.3)

## 2022-10-02 LAB — HEMOGLOBIN A1C: Hgb A1c MFr Bld: 9.3 % — ABNORMAL HIGH (ref 4.6–6.5)

## 2022-10-02 LAB — TSH: TSH: 3.06 u[IU]/mL (ref 0.35–5.50)

## 2022-10-02 MED ORDER — EMPAGLIFLOZIN 10 MG PO TABS: 10.0000 mg | ORAL_TABLET | Freq: Every day | ORAL | 3 refills | Status: DC

## 2022-10-02 MED ORDER — BYDUREON 2 MG ~~LOC~~ PEN: 2.0000 mg | PEN_INJECTOR | SUBCUTANEOUS | 3 refills | Status: DC

## 2022-10-02 NOTE — Assessment & Plan Note (Signed)
BMI is currently 41.62  Encouraged low carb diet and regular physical activity.  Will continue to monitor.

## 2022-10-02 NOTE — Assessment & Plan Note (Signed)
Chronic problem, on Crestor '10mg'$  daily w/o difficulty.  Check labs.  Adjust meds prn  ?

## 2022-10-02 NOTE — Patient Instructions (Signed)
Follow up in 3-4 months to recheck diabetes We'll notify you of your lab results and make any changes if needed Try and limit your bread/carb intake Add regular exercise Call with any questions or concerns Stay Safe!  Stay Healthy! Enjoy the rest of your summer!!!

## 2022-10-02 NOTE — Progress Notes (Signed)
   Subjective:    Patient ID: Belinda Reilly, female    DOB: 11-Nov-1975, 47 y.o.   MRN: [id number]  HPI DM- chronic problem, on Metformin 1000mg  BID, Jardiance 10mg  daily, Trulicity 4.5mg  weekly.  Not currently taking the Trulicity b/c it has not been in stock.  Not taking Jardiance due to cost.  Unfortunately she didn't make me aware of either of these issues.  Last A1C 9%.  Due for foot exam.  UTD on microalbumin, eye exam.  Pt reports mid-day fatigue.  No CP, SOB, HA's, visual changes.  Hyperlipidemia- chronic problem.  Currently on Crestor 10mg  daily.  No abd pain, N/V.  Obesity- at last visit was 248.  Currently 250 lbs.  No regular exercise.  Not following a low carb diet.   Review of Systems For ROS see HPI     Objective:   Physical Exam Vitals reviewed.  Constitutional:      General: She is not in acute distress.    Appearance: Normal appearance. She is well-developed. She is obese. She is not ill-appearing.  HENT:     Head: Normocephalic and atraumatic.  Eyes:     Conjunctiva/sclera: Conjunctivae normal.     Pupils: Pupils are equal, round, and reactive to light.  Neck:     Thyroid: No thyromegaly.  Cardiovascular:     Rate and Rhythm: Normal rate and regular rhythm.     Pulses: Normal pulses.     Heart sounds: Normal heart sounds. No murmur heard. Pulmonary:     Effort: Pulmonary effort is normal. No respiratory distress.     Breath sounds: Normal breath sounds.  Abdominal:     General: There is no distension.     Palpations: Abdomen is soft.     Tenderness: There is no abdominal tenderness.  Musculoskeletal:     Cervical back: Normal range of motion and neck supple.     Right lower leg: No edema.     Left lower leg: No edema.  Lymphadenopathy:     Cervical: No cervical adenopathy.  Skin:    General: Skin is warm and dry.  Neurological:     General: No focal deficit present.     Mental Status: She is alert and oriented to person, place, and time.   Psychiatric:        Mood and Affect: Mood normal.        Behavior: Behavior normal.        Thought Content: Thought content normal.           Assessment & Plan:

## 2022-10-02 NOTE — Assessment & Plan Note (Signed)
Chronic problem.  Pt has hx of poor glycemic control.  Last A1C 9%  Unfortunately, I did not know she wasn't taking the Trulicity or Jardiance b/c I would have made changes.  Foot exam done today.  UTD on eye exam and microalbumin.  Says she doesn't eat sweets but admits to biscuits, bagels, etc.  Encouraged her to decrease carb intake.  Will switch to Bydureon and try and Jardiance approved via prior auth.  Pt expressed understanding and is in agreement w/ plan.

## 2022-10-03 ENCOUNTER — Telehealth: Payer: Self-pay

## 2022-10-03 NOTE — Telephone Encounter (Signed)
I have left the detailed lab results on pt VM

## 2022-10-03 NOTE — Telephone Encounter (Signed)
-----   Message from Neena Rhymes sent at 10/02/2022  8:39 PM EDT ----- Labs are stable and ok w/ exception of A1C and sugar.  Your A1C remains quite high at 9.3% and at these levels, I worry about future complications.  PLEASE limit your carb intake- breads, pastas, rice, biscuits, potatoes, bagels, etc.  And if for whatever reason you can't get the medications or they are too expensive- let me know right away so we can make changes.

## 2022-10-19 ENCOUNTER — Telehealth: Payer: Self-pay | Admitting: Family Medicine

## 2022-10-19 NOTE — Telephone Encounter (Signed)
Caller name: Belinda Reilly  On DPR?: Yes  Call back number: [phone] (mobile)  Provider they see: Sheliah Hatch, MD  Reason for call:   My med for DM 3rd dose feels like a knot under skin where injection site is now has become itchy- advise

## 2022-10-19 NOTE — Telephone Encounter (Signed)
Pt states after her third injection of the Bydueron she has a knot under injection site . Would you like pt to make an apt ?

## 2022-10-19 NOTE — Telephone Encounter (Signed)
I have informed pt to move sites and changes sites regularly

## 2022-10-19 NOTE — Telephone Encounter (Signed)
Knots at the injection site are common.  It is important to rotate the sites you are injecting and avoid using the exact same location repeatedly.

## 2022-11-20 ENCOUNTER — Other Ambulatory Visit: Payer: Self-pay | Admitting: Family Medicine

## 2022-11-20 DIAGNOSIS — Z1231 Encounter for screening mammogram for malignant neoplasm of breast: Secondary | ICD-10-CM

## 2022-12-13 ENCOUNTER — Ambulatory Visit
Admission: RE | Admit: 2022-12-13 | Discharge: 2022-12-13 | Disposition: A | Discharge status: Home / Self Care | Payer: BC Managed Care – PPO | Source: Ambulatory Visit | Attending: Family Medicine | Admitting: Family Medicine

## 2022-12-13 DIAGNOSIS — Z1231 Encounter for screening mammogram for malignant neoplasm of breast: Secondary | ICD-10-CM | POA: Diagnosis not present

## 2023-01-02 ENCOUNTER — Ambulatory Visit: Payer: BC Managed Care – PPO | Admitting: Family Medicine

## 2023-01-07 ENCOUNTER — Other Ambulatory Visit: Payer: Self-pay | Admitting: Family Medicine

## 2023-01-07 DIAGNOSIS — E1169 Type 2 diabetes mellitus with other specified complication: Secondary | ICD-10-CM

## 2023-01-08 ENCOUNTER — Other Ambulatory Visit: Payer: Self-pay | Admitting: Family Medicine

## 2023-01-15 ENCOUNTER — Ambulatory Visit: Payer: BC Managed Care – PPO | Admitting: Family Medicine

## 2023-01-17 ENCOUNTER — Ambulatory Visit (INDEPENDENT_AMBULATORY_CARE_PROVIDER_SITE_OTHER): Payer: BC Managed Care – PPO | Admitting: Family Medicine

## 2023-01-17 ENCOUNTER — Telehealth: Payer: Self-pay

## 2023-01-17 VITALS — BP 104/68 | HR 80 | Temp 97.8°F | Ht 65.0 in | Wt 238.4 lb

## 2023-01-17 DIAGNOSIS — Z1159 Encounter for screening for other viral diseases: Secondary | ICD-10-CM | POA: Diagnosis not present

## 2023-01-17 DIAGNOSIS — E119 Type 2 diabetes mellitus without complications: Secondary | ICD-10-CM

## 2023-01-17 DIAGNOSIS — Z7984 Long term (current) use of oral hypoglycemic drugs: Secondary | ICD-10-CM | POA: Diagnosis not present

## 2023-01-17 LAB — BASIC METABOLIC PANEL
BUN: 18 mg/dL (ref 6–23)
CO2: 26 meq/L (ref 19–32)
Calcium: 9.5 mg/dL (ref 8.4–10.5)
Chloride: 101 meq/L (ref 96–112)
Creatinine, Ser: 0.94 mg/dL (ref 0.40–1.20)
GFR: 71.77 mL/min (ref >60.00)
Glucose, Bld: 173 mg/dL — ABNORMAL HIGH (ref 70–99)
Potassium: 4.5 meq/L (ref 3.5–5.1)
Sodium: 134 meq/L — ABNORMAL LOW (ref 135–145)

## 2023-01-17 LAB — HEMOGLOBIN A1C: Hgb A1c MFr Bld: 7.4 % — ABNORMAL HIGH (ref 4.6–6.5)

## 2023-01-17 NOTE — Patient Instructions (Signed)
Schedule your complete physical in 3-4 months We'll notify you of your lab results and make any changes if needed Continue to work on healthy diet and regular exercise- you can do it! Call with any questions or concerns Stay Safe!  Stay Healthy! Happy Holidays!!!

## 2023-01-17 NOTE — Assessment & Plan Note (Signed)
Ongoing issue.  Is now taking all of her medications- including new Bydureon.  Is down 12 lbs!  Last A1C 9.3%- expect this to be much better.  Currently asymptomatic.  Check labs.  Adjust meds prn

## 2023-01-17 NOTE — Telephone Encounter (Signed)
-----   Message from Neena Rhymes sent at 01/17/2023  4:28 PM EST ----- BOOM!  Check out that A1C!!  That's what I'm talking about!  Keep up the good work!

## 2023-01-17 NOTE — Progress Notes (Signed)
   Subjective:    Patient ID: Belinda Reilly, female    DOB: [birth year], 47 y.o.   MRN: [id number]  HPI DM- chronic problem.  Down 12 lbs!  Currently on Jardiance, Bydureon 2mg  weekly, Metformin 1000mg  BID.  Last A1C 9.3%.  UTD on eye exam, foot exam.  Due for microalbumin.  Denies CP, SOB, HA's, visual changes, abd pain, N/V.  Denies symptomatic lows.  No numbness/tingling of hands/feet.   Review of Systems For ROS see HPI     Objective:   Physical Exam Vitals reviewed.  Constitutional:      General: She is not in acute distress.    Appearance: Normal appearance. She is well-developed. She is obese. She is not ill-appearing.  HENT:     Head: Normocephalic and atraumatic.  Eyes:     Conjunctiva/sclera: Conjunctivae normal.     Pupils: Pupils are equal, round, and reactive to light.  Neck:     Thyroid: No thyromegaly.  Cardiovascular:     Rate and Rhythm: Normal rate and regular rhythm.     Pulses: Normal pulses.     Heart sounds: Normal heart sounds. No murmur heard. Pulmonary:     Effort: Pulmonary effort is normal. No respiratory distress.     Breath sounds: Normal breath sounds.  Abdominal:     General: There is no distension.     Palpations: Abdomen is soft.     Tenderness: There is no abdominal tenderness.  Musculoskeletal:     Cervical back: Normal range of motion and neck supple.     Right lower leg: No edema.     Left lower leg: No edema.  Lymphadenopathy:     Cervical: No cervical adenopathy.  Skin:    General: Skin is warm and dry.  Neurological:     General: No focal deficit present.     Mental Status: She is alert and oriented to person, place, and time.  Psychiatric:        Mood and Affect: Mood normal.        Behavior: Behavior normal.        Thought Content: Thought content normal.           Assessment & Plan:

## 2023-01-17 NOTE — Telephone Encounter (Signed)
Lvm for patient regarding labs. Let her know that she could also view them via mychart. Made her aware on vm that the office will be closed tomorrow and Friday.

## 2023-01-19 LAB — HEPATITIS C ANTIBODY: Hepatitis C Ab: NONREACTIVE

## 2023-01-22 NOTE — Telephone Encounter (Signed)
Pt has reviewed via MyChart

## 2023-02-28 ENCOUNTER — Other Ambulatory Visit: Payer: Self-pay | Admitting: Family Medicine

## 2023-03-07 LAB — HM DIABETES EYE EXAM

## 2023-05-01 ENCOUNTER — Other Ambulatory Visit: Payer: Self-pay | Admitting: Family Medicine

## 2023-05-14 ENCOUNTER — Ambulatory Visit: Payer: Self-pay | Admitting: *Deleted

## 2023-05-14 NOTE — Telephone Encounter (Signed)
  Chief Complaint: possible medication side effect : jardiance. vaginal sx Symptoms: since starting Jardiance noted vaginal discharge clear, odor, itching severe at times. Reports no intercourse . Frequency: last week  Pertinent Negatives: Patient denies fever, no other sx  Disposition: [] ED /[] Urgent Care (no appt availability in office) / [x] Appointment(In office/virtual)/ []  [facility]/ [] Home Care/ [] Refused Recommended Disposition /[] [place] [facility]/ []  Follow-up with PCP Additional Notes:   Appt scheduled with PCP. Patient declined sooner appt and would like to keep appt with PCP unless earlier appt available with PCP. Please advise. Recommended to call back for worsening sx. Recommended showers mild soaps, no tub baths.         Copied from CRM [id number]. Topic: Clinical - Red Word Triage >> May 14, 2023 10:53 AM Lennart Pall wrote: Red Word that prompted transfer to Nurse Triage: Patient is having side effects to medication-jardiance- patient thinks it caused a yeast infection. smell started last week- itchyness has been going on for a few months. Very uncomfortable and starting to be painful. Reason for Disposition  Prescription request for new medicine (not a refill)  Answer Assessment - Initial Assessment Questions 1. NAME of MEDICINE: "What medicine(s) are you calling about?"     Jardiance  2. QUESTION: "What is your question?" (e.g., double dose of medicine, side effect)     Is medication causing sx of "yeast infection"?  3. PRESCRIBER: "Who prescribed the medicine?" Reason: if prescribed by specialist, call should be referred to that group.     PCP  4. SYMPTOMS: "Do you have any symptoms?" If Yes, ask: "What symptoms are you having?"  "How bad are the symptoms (e.g., mild, moderate, severe)    Mild vaginal discharge clear, odor present, itching 5. PREGNANCY:  "Is there any chance that you are pregnant?" "When was your last menstrual period?"      na  Protocols used: Medication Question Call-A-AH

## 2023-05-14 NOTE — Telephone Encounter (Signed)
 Pt has appt with Waymon Budge 05/15/2022

## 2023-05-15 ENCOUNTER — Ambulatory Visit (INDEPENDENT_AMBULATORY_CARE_PROVIDER_SITE_OTHER): Admitting: Family Medicine

## 2023-05-15 ENCOUNTER — Other Ambulatory Visit (HOSPITAL_COMMUNITY)
Admission: RE | Admit: 2023-05-15 | Discharge: 2023-05-15 | Disposition: A | Discharge status: Home / Self Care | Source: Ambulatory Visit | Attending: Family Medicine | Admitting: Family Medicine

## 2023-05-15 VITALS — BP 122/70 | HR 91 | Temp 98.1°F | Wt 247.0 lb

## 2023-05-15 DIAGNOSIS — N898 Other specified noninflammatory disorders of vagina: Secondary | ICD-10-CM | POA: Insufficient documentation

## 2023-05-15 DIAGNOSIS — R300 Dysuria: Secondary | ICD-10-CM

## 2023-05-15 DIAGNOSIS — B3731 Acute candidiasis of vulva and vagina: Secondary | ICD-10-CM

## 2023-05-15 LAB — POCT URINALYSIS DIPSTICK
Bilirubin, UA: NEGATIVE
Blood, UA: NEGATIVE
Glucose, UA: POSITIVE — AB
Ketones, UA: NEGATIVE
Leukocytes, UA: NEGATIVE
Nitrite, UA: NEGATIVE
Protein, UA: POSITIVE — AB
Spec Grav, UA: 1.015 (ref 1.010–1.025)
Urobilinogen, UA: 0.2 U/dL
pH, UA: 6 (ref 5.0–8.0)

## 2023-05-15 NOTE — Progress Notes (Unsigned)
   Acute Office Visit   Subjective:  Patient ID: Belinda Reilly, female    DOB: 21-Jan-1976, 48 y.o.   MRN: [id number]  Chief Complaint  Patient presents with   Urinary Tract Infection    Urinary Tract Infection    Patient is present for an acute visit. She is experiencing: Vaginal odor 1.5 weeks ago Vaginal itching-back in November, intermittent  Dysuria-only when she has scratched vaginal area  a lot  Denies vaginal discharge or bleeding, denies increase urgency/frequency, hematuria, fever, nausea, vomiting, or abd pain.  ROS See HPI above      Objective:   BP 122/70   Pulse 91   Temp 98.1 F (36.7 C) (Oral)   Wt 247 lb (112 kg)   SpO2 98%   BMI 41.10 kg/m  {Vitals History (Optional):23777}  Physical Exam Vitals reviewed.  Constitutional:      General: She is not in acute distress.    Appearance: Normal appearance. She is obese. She is not ill-appearing, toxic-appearing or diaphoretic.  Eyes:     General:        Right eye: No discharge.        Left eye: No discharge.     Conjunctiva/sclera: Conjunctivae normal.  Cardiovascular:     Rate and Rhythm: Normal rate and regular rhythm.     Heart sounds: Normal heart sounds. No murmur heard.    No friction rub. No gallop.  Pulmonary:     Effort: Pulmonary effort is normal. No respiratory distress.     Breath sounds: Normal breath sounds.  Musculoskeletal:        General: Normal range of motion.  Skin:    General: Skin is warm and dry.  Neurological:     General: No focal deficit present.     Mental Status: She is alert and oriented to person, place, and time. Mental status is at baseline.  Psychiatric:        Mood and Affect: Mood normal.        Behavior: Behavior normal.        Thought Content: Thought content normal.        Judgment: Judgment normal.     No results found for any visits on 05/15/23.      Assessment & Plan:  There are no diagnoses linked to this encounter.  No follow-ups on  file.  Zandra Abts, NP

## 2023-05-16 LAB — CERVICOVAGINAL ANCILLARY ONLY
Bacterial Vaginitis (gardnerella): NEGATIVE
Candida Glabrata: NEGATIVE
Candida Vaginitis: POSITIVE — AB
Comment: NEGATIVE
Comment: NEGATIVE
Comment: NEGATIVE

## 2023-05-16 NOTE — Patient Instructions (Signed)
-  Urinalysis completed/ Positive for glucose which is normal for you since taking Jardiance. Protein in urine, recommend to stay hydrated. No signs of urinary tract infection. -Patient self swabbed vagina for yeast and BV. Will treat accordingly.  -Discussed about the importance of good genital hygiene since Jardiance pulls sugar into the urine and can increase the ability for yeast infections and UTIs.

## 2023-05-17 ENCOUNTER — Encounter: Payer: Self-pay | Admitting: Family Medicine

## 2023-05-17 ENCOUNTER — Encounter: Payer: BC Managed Care – PPO | Admitting: Family Medicine

## 2023-05-17 MED ORDER — FLUCONAZOLE 150 MG PO TABS
ORAL_TABLET | ORAL | 0 refills | Status: DC
Start: 2023-05-17 — End: 2023-07-25

## 2023-05-17 NOTE — Addendum Note (Signed)
 Addended by: Kenna Gilbert B on: 05/17/2023 08:52 AM   Modules accepted: Orders

## 2023-05-22 ENCOUNTER — Ambulatory Visit: Admitting: Family Medicine

## 2023-05-30 ENCOUNTER — Other Ambulatory Visit: Payer: Self-pay | Admitting: Family Medicine

## 2023-05-30 DIAGNOSIS — E1169 Type 2 diabetes mellitus with other specified complication: Secondary | ICD-10-CM

## 2023-06-25 ENCOUNTER — Other Ambulatory Visit: Payer: Self-pay

## 2023-06-25 MED ORDER — NORETHIN ACE-ETH ESTRAD-FE 1.5-30 MG-MCG PO TABS: 1.0000 | ORAL_TABLET | Freq: Every day | ORAL | 6 refills | Status: DC

## 2023-07-23 ENCOUNTER — Encounter: Payer: Self-pay | Admitting: Family Medicine

## 2023-07-23 ENCOUNTER — Ambulatory Visit: Admitting: Family Medicine

## 2023-07-23 VITALS — BP 108/62 | HR 98 | Temp 98.0°F | Ht 65.0 in | Wt 246.4 lb

## 2023-07-23 DIAGNOSIS — N898 Other specified noninflammatory disorders of vagina: Secondary | ICD-10-CM | POA: Diagnosis not present

## 2023-07-23 DIAGNOSIS — Z0001 Encounter for general adult medical examination with abnormal findings: Secondary | ICD-10-CM

## 2023-07-23 DIAGNOSIS — R7989 Other specified abnormal findings of blood chemistry: Secondary | ICD-10-CM | POA: Diagnosis not present

## 2023-07-23 DIAGNOSIS — E119 Type 2 diabetes mellitus without complications: Secondary | ICD-10-CM

## 2023-07-23 DIAGNOSIS — Z0000 Encounter for general adult medical examination without abnormal findings: Secondary | ICD-10-CM

## 2023-07-23 LAB — CBC WITH DIFFERENTIAL/PLATELET
Basophils Absolute: 0.1 10*3/uL (ref 0.0–0.1)
Basophils Relative: 0.6 % (ref 0.0–3.0)
Eosinophils Absolute: 0.2 10*3/uL (ref 0.0–0.7)
Eosinophils Relative: 1.8 % (ref 0.0–5.0)
HCT: 34.8 % — ABNORMAL LOW (ref 36.0–46.0)
Hemoglobin: 11.8 g/dL — ABNORMAL LOW (ref 12.0–15.0)
Lymphocytes Relative: 36.1 % (ref 12.0–46.0)
Lymphs Abs: 3.7 10*3/uL (ref 0.7–4.0)
MCHC: 33.9 g/dL (ref 30.0–36.0)
MCV: 79 fl (ref 78.0–100.0)
Monocytes Absolute: 0.5 10*3/uL (ref 0.1–1.0)
Monocytes Relative: 4.4 % (ref 3.0–12.0)
Neutro Abs: 5.9 10*3/uL (ref 1.4–7.7)
Neutrophils Relative %: 57.1 % (ref 43.0–77.0)
Platelets: 381 10*3/uL (ref 150.0–400.0)
RBC: 4.41 Mil/uL (ref 3.87–5.11)
RDW: 15 % (ref 11.5–15.5)
WBC: 10.4 10*3/uL (ref 4.0–10.5)

## 2023-07-23 LAB — HEPATIC FUNCTION PANEL
ALT: 12 U/L (ref 0–35)
AST: 12 U/L (ref 0–37)
Albumin: 3.9 g/dL (ref 3.5–5.2)
Alkaline Phosphatase: 40 U/L (ref 39–117)
Bilirubin, Direct: 0.1 mg/dL (ref 0.0–0.3)
Total Bilirubin: 0.7 mg/dL (ref 0.2–1.2)
Total Protein: 7.1 g/dL (ref 6.0–8.3)

## 2023-07-23 LAB — LIPID PANEL
Cholesterol: 169 mg/dL (ref 0–200)
HDL: 47.2 mg/dL (ref >39.00)
LDL Cholesterol: 71 mg/dL (ref 0–99)
NonHDL: 121.95
Total CHOL/HDL Ratio: 4
Triglycerides: 255 mg/dL — ABNORMAL HIGH (ref 0.0–149.0)
VLDL: 51 mg/dL — ABNORMAL HIGH (ref 0.0–40.0)

## 2023-07-23 LAB — MICROALBUMIN / CREATININE URINE RATIO
Creatinine,U: 66.4 mg/dL
Microalb Creat Ratio: 11.7 mg/g (ref 0.0–30.0)
Microalb, Ur: 0.8 mg/dL (ref 0.0–1.9)

## 2023-07-23 LAB — BASIC METABOLIC PANEL WITH GFR
BUN: 25 mg/dL — ABNORMAL HIGH (ref 6–23)
CO2: 24 meq/L (ref 19–32)
Calcium: 8.9 mg/dL (ref 8.4–10.5)
Chloride: 100 meq/L (ref 96–112)
Creatinine, Ser: 1.17 mg/dL (ref 0.40–1.20)
GFR: 55 mL/min — ABNORMAL LOW (ref >60.00)
Glucose, Bld: 215 mg/dL — ABNORMAL HIGH (ref 70–99)
Potassium: 4 meq/L (ref 3.5–5.1)
Sodium: 133 meq/L — ABNORMAL LOW (ref 135–145)

## 2023-07-23 LAB — TSH: TSH: 0.97 u[IU]/mL (ref 0.35–5.50)

## 2023-07-23 LAB — HEMOGLOBIN A1C: Hgb A1c MFr Bld: 7 % — ABNORMAL HIGH (ref 4.6–6.5)

## 2023-07-23 LAB — VITAMIN D 25 HYDROXY (VIT D DEFICIENCY, FRACTURES): VITD: 13.49 ng/mL — ABNORMAL LOW (ref 30.00–100.00)

## 2023-07-23 NOTE — Patient Instructions (Signed)
 Follow up in 3-4 months to recheck sugar We'll notify you of your lab results and make any changes if needed Continue to work on healthy diet and regular exercise- you can do it! Call with any questions or concerns Stay Safe!  Stay Healthy! HAPPY BIRTHDAY!!!

## 2023-07-23 NOTE — Progress Notes (Signed)
   Subjective:    Patient ID: Belinda Reilly, female    DOB: Nov 27, 1975, 48 y.o.   MRN: [id number]  HPI CPE- UTD on foot exam, eye exam, pap, mammo, colonoscopy, Tdap  Patient Care Team    Relationship Specialty Notifications Start End  Jess Morita, MD PCP - General Family Medicine  12/18/12      Health Maintenance  Topic Date Due   Pneumococcal Vaccine 1-9 Years old (1 of 2 - PCV) Never done   COVID-19 Vaccine (5 - 2024-25 season) 10/22/2022   Diabetic kidney evaluation - Urine ACR  12/21/2022   HEMOGLOBIN A1C  07/17/2023   INFLUENZA VACCINE  09/21/2023   FOOT EXAM  10/02/2023   Diabetic kidney evaluation - eGFR measurement  01/17/2024   OPHTHALMOLOGY EXAM  03/06/2024   Cervical Cancer Screening (HPV/Pap Cotest)  07/21/2025   DTaP/Tdap/Td (2 - Td or Tdap) 02/25/2029   Colonoscopy  03/17/2030   Hepatitis C Screening  Completed   HIV Screening  Completed   HPV VACCINES  Aged Out   Meningococcal B Vaccine  Aged Out      Review of Systems Patient reports no vision/ hearing changes, adenopathy,fever, weight change,  persistant/recurrent hoarseness , swallowing issues, chest pain, palpitations, edema, persistant/recurrent cough, hemoptysis, dyspnea (rest/exertional/paroxysmal nocturnal), gastrointestinal bleeding (melena, rectal bleeding), abdominal pain, significant heartburn, bowel changes, GU symptoms (dysuria, hematuria, incontinence),  syncope, focal weakness, memory loss, numbness & tingling, skin/hair/nail changes, abnormal bruising or bleeding, anxiety, or depression.   + vaginal odor and itching    Objective:   Physical Exam General Appearance:    Alert, cooperative, no distress, appears stated age, obese  Head:    Normocephalic, without obvious abnormality, atraumatic  Eyes:    PERRL, conjunctiva/corneas clear, EOM's intact both eyes  Ears:    Normal TM's and external ear canals, both ears  Nose:   Nares normal, septum midline, mucosa normal, no drainage     or sinus tenderness  Throat:   Lips, mucosa, and tongue normal; teeth and gums normal  Neck:   Supple, symmetrical, trachea midline, no adenopathy;    Thyroid : no enlargement/tenderness/nodules  Back:     Symmetric, no curvature, ROM normal, no CVA tenderness  Lungs:     Clear to auscultation bilaterally, respirations unlabored  Chest Wall:    No tenderness or deformity   Heart:    Regular rate and rhythm, S1 and S2 normal, no murmur, rub   or gallop  Breast Exam:    Deferred to GYN  Abdomen:     Soft, non-tender, bowel sounds active all four quadrants,    no masses, no organomegaly  Genitalia:    Deferred to GYN  Rectal:    Extremities:   Extremities normal, atraumatic, no cyanosis or edema  Pulses:   2+ and symmetric all extremities  Skin:   Skin color, texture, turgor normal, no rashes or lesions  Lymph nodes:   Cervical, supraclavicular, and axillary nodes normal  Neurologic:   CNII-XII intact, normal strength, sensation and reflexes    throughout          Assessment & Plan:

## 2023-07-24 ENCOUNTER — Ambulatory Visit: Payer: Self-pay | Admitting: Family Medicine

## 2023-07-24 ENCOUNTER — Other Ambulatory Visit: Payer: Self-pay

## 2023-07-24 DIAGNOSIS — B3731 Acute candidiasis of vulva and vagina: Secondary | ICD-10-CM

## 2023-07-24 NOTE — Telephone Encounter (Signed)
-----   Message from Laymon Priest sent at 07/24/2023  7:34 AM EDT ----- Vit D is low.  Based on this, we need to start 50,000 units weekly x12 weeks in addition to daily OTC supplement of at least 2000 units.   A1C looks better!!!  Keep up the good work!!  Comptroller of labs are stable and look good!  No changes at this time

## 2023-07-25 LAB — SURESWAB® ADVANCED VAGINITIS PLUS,TMA
C. trachomatis RNA, TMA: NOT DETECTED
CANDIDA SPECIES: DETECTED — AB
Candida glabrata: NOT DETECTED
N. gonorrhoeae RNA, TMA: NOT DETECTED
SURESWAB(R) ADV BACTERIAL VAGINOSIS(BV),TMA: NEGATIVE
TRICHOMONAS VAGINALIS (TV),TMA: NOT DETECTED

## 2023-07-25 MED ORDER — FLUCONAZOLE 150 MG PO TABS: ORAL_TABLET | ORAL | 0 refills | Status: DC

## 2023-07-25 NOTE — Progress Notes (Signed)
 Pt has reviewed results via MyChart.

## 2023-07-25 NOTE — Addendum Note (Signed)
 Addended by: Mathhew Buysse E on: 07/25/2023 10:56 AM   Modules accepted: Orders

## 2023-08-20 NOTE — Assessment & Plan Note (Addendum)
 Pt's PE WNL w/ exception of BMI.  UTD on foot exam, eye exam, pap, mammo, colonoscopy, Tdap.  Microalbumin ordered.  Declines vaccines today.  Check labs.  Anticipatory guidance provided.

## 2023-08-22 ENCOUNTER — Telehealth: Payer: Self-pay

## 2023-08-22 NOTE — Telephone Encounter (Signed)
 Copied from CRM [id number]. Topic: Clinical - Prescription Issue >> Aug 22, 2023 11:23 AM Rosina BIRCH wrote: Reason for CRM: patient called stating the pharmacy stop making the Exenatide  ER (BYDUREON  BCISE) 2 MG/0.85ML AUIJ so she need another kind  CB [phone]

## 2023-08-22 NOTE — Telephone Encounter (Signed)
 Can we please call the pharmacy or forward this to the pharmacy team to determine what alternative is preferred?

## 2023-08-22 NOTE — Telephone Encounter (Signed)
 That's amazing info!  Thank you for your help!  Mounjaro 2.5mg  weekly, disp 2ml, 1 refill would be perfect

## 2023-08-22 NOTE — Telephone Encounter (Signed)
 Can you help with this?

## 2023-08-22 NOTE — Telephone Encounter (Signed)
 IN past Bydueron cost was $50 / 28 days. Called [facility] and cost for either Mounjaro or Ozempic will need prior authorization but once prior authorization is approved her cost will be $50 / 28 days or $100 for 90 days thru their mail order. She could also use manufacture discount card for Mounjaro or Ozempic and should lower cost for 28 days at local pharmacy to $25 / month.  Forwarding to PCP to see which she want to change to Bydureon  2mg  = Mounjaro 2.5mg  or Ozempic 0.5mg 

## 2023-08-22 NOTE — Telephone Encounter (Signed)
 I think her OCP's are more hormonal and less contraceptive at this point, but it's always great to ask!  I agree, I see less side effects and better results w/ Mounjaro.  I'm good with letting her decide.

## 2023-08-23 MED ORDER — TIRZEPATIDE 2.5 MG/0.5ML ~~LOC~~ SOAJ: 2.5000 mg | SUBCUTANEOUS | 0 refills | Status: DC

## 2023-08-23 NOTE — Telephone Encounter (Signed)
 Moujaro has been filled.

## 2023-08-23 NOTE — Addendum Note (Signed)
 Addended by: CARLA MILLING B on: 08/23/2023 09:55 AM   Modules accepted: Orders

## 2023-08-23 NOTE — Telephone Encounter (Unsigned)
 Copied from CRM [id number]. Topic: Clinical - Prescription Issue >> Aug 22, 2023 11:23 AM Rosina BIRCH wrote: Reason for CRM: patient called stating the pharmacy stop making the Exenatide  ER (BYDUREON  BCISE) 2 MG/0.85ML AUIJ so she need another kind  CB [phone] >> Aug 22, 2023  4:48 PM CMA Lila C wrote: Wrong office >> Aug 22, 2023  4:45 PM [place] J wrote: Patient returning call in regards to Mounjaro. I explained the conflict with her birth control as stated in the notes left by Tammy and Dr. Mahlon. The patient has agreed to go through with Mounjaro and would like it to be sent to:   [id number] [place], [place] - [address] AT [facility] OF [address] & [facility] [address]  [address] [place] [id number]  Phone: [phone] Fax: [phone]  Hours: Not open 24 hours

## 2023-08-23 NOTE — Telephone Encounter (Signed)
 I tried to complete prior authorization but Cover [employer] Meds states no prior authorization needed. Called [facility] and Mounjaro was processed and cost is $25. They do not have 2.5mg  dose in stock but will have it Monday 7/7. Patient was notified. She has 1 more dose of Byetta so Mounjaro won't be needed until next week. Will check back in 4 weeks to discuss titrating dose to 5mg  weekly

## 2023-09-23 ENCOUNTER — Other Ambulatory Visit: Payer: Self-pay | Admitting: Family Medicine

## 2023-09-27 ENCOUNTER — Other Ambulatory Visit: Payer: Self-pay

## 2023-09-27 MED ORDER — EMPAGLIFLOZIN 10 MG PO TABS: 10.0000 mg | ORAL_TABLET | Freq: Every day | ORAL | 3 refills | Status: DC

## 2023-10-23 ENCOUNTER — Ambulatory Visit: Admitting: Family Medicine

## 2023-10-23 VITALS — BP 120/70 | HR 78 | Temp 98.0°F | Ht 65.0 in | Wt 246.4 lb

## 2023-10-23 DIAGNOSIS — E119 Type 2 diabetes mellitus without complications: Secondary | ICD-10-CM

## 2023-10-23 DIAGNOSIS — E1169 Type 2 diabetes mellitus with other specified complication: Secondary | ICD-10-CM

## 2023-10-23 DIAGNOSIS — Z7984 Long term (current) use of oral hypoglycemic drugs: Secondary | ICD-10-CM

## 2023-10-23 LAB — BASIC METABOLIC PANEL WITH GFR
BUN: 14 mg/dL (ref 6–23)
CO2: 23 meq/L (ref 19–32)
Calcium: 9 mg/dL (ref 8.4–10.5)
Chloride: 102 meq/L (ref 96–112)
Creatinine, Ser: 0.88 mg/dL (ref 0.40–1.20)
GFR: 77.27 mL/min (ref >60.00)
Glucose, Bld: 202 mg/dL — ABNORMAL HIGH (ref 70–99)
Potassium: 4 meq/L (ref 3.5–5.1)
Sodium: 134 meq/L — ABNORMAL LOW (ref 135–145)

## 2023-10-23 LAB — HEMOGLOBIN A1C: Hgb A1c MFr Bld: 7.8 % — ABNORMAL HIGH (ref 4.6–6.5)

## 2023-10-23 MED ORDER — TIRZEPATIDE 2.5 MG/0.5ML ~~LOC~~ SOAJ: 2.5000 mg | SUBCUTANEOUS | 0 refills | Status: DC

## 2023-10-23 NOTE — Progress Notes (Signed)
   Subjective:    Patient ID: Belinda Reilly, female    DOB: 1975-02-22, 48 y.o.   MRN: [id number]  HPI DM- chronic problem, on Mounjaro  2.5mg  weekly (but has not been taking), Metformin  1000mg  BID, Jardiance  10mg  daily.  Last A1C 7%.  UTD on eye exam, due for foot exam.  UTD on microalbumin.  No CP, SOB, HA's, visual changes, abd pain, N/V.  No numbness/tingling of hands/feet.   Review of Systems For ROS see HPI     Objective:   Physical Exam Vitals reviewed.  Constitutional:      General: She is not in acute distress.    Appearance: Normal appearance. She is well-developed. She is obese. She is not ill-appearing.  HENT:     Head: Normocephalic and atraumatic.  Eyes:     Conjunctiva/sclera: Conjunctivae normal.     Pupils: Pupils are equal, round, and reactive to light.  Neck:     Thyroid : No thyromegaly.  Cardiovascular:     Rate and Rhythm: Normal rate and regular rhythm.     Pulses: Normal pulses.     Heart sounds: Normal heart sounds. No murmur heard. Pulmonary:     Effort: Pulmonary effort is normal. No respiratory distress.     Breath sounds: Normal breath sounds.  Abdominal:     General: There is no distension.     Palpations: Abdomen is soft.     Tenderness: There is no abdominal tenderness.  Musculoskeletal:     Cervical back: Normal range of motion and neck supple.     Right lower leg: No edema.     Left lower leg: No edema.  Lymphadenopathy:     Cervical: No cervical adenopathy.  Skin:    General: Skin is warm and dry.  Neurological:     General: No focal deficit present.     Mental Status: She is alert and oriented to person, place, and time. Mental status is at baseline.  Psychiatric:        Mood and Affect: Mood normal.        Behavior: Behavior normal.        Thought Content: Thought content normal.           Assessment & Plan:

## 2023-10-23 NOTE — Patient Instructions (Signed)
 Follow up in 3-4 months to recheck sugar, blood pressure, cholesterol We'll notify you of your lab results and make any changes if needed Continue to work on healthy diet and regular exercise- you can do it! Make sure you're eating leafy greens, increased water, good fiber Call with any questions or concerns Stay Safe!  Stay Healthy! Congrats on all the big changes!

## 2023-10-23 NOTE — Assessment & Plan Note (Signed)
 Chronic problem.  Currently on Metformin  1000mg  BID, Jardiance  10mg  daily.  Has not been taking Mounjaro  but wants to restart.  UTD on eye exam, microalbumin.  Foot exam done today.  Currently asymptomatic.  Check labs.  Adjust meds prn

## 2023-10-24 ENCOUNTER — Ambulatory Visit: Payer: Self-pay | Admitting: Family Medicine

## 2023-11-20 NOTE — Progress Notes (Signed)
 Corley S [place]                                          MRN: [id number]   11/20/2023   The [facility] Quality Team Specialist reviewed this patient medical record for the purposes of chart review for care gap closure. The following were reviewed: abstraction for care gap closure-kidney health evaluation for diabetes:eGFR  and uACR.    [facility] Quality Team

## 2023-11-23 ENCOUNTER — Other Ambulatory Visit: Payer: Self-pay | Admitting: Family Medicine

## 2023-12-25 ENCOUNTER — Other Ambulatory Visit: Payer: Self-pay | Admitting: Family Medicine

## 2023-12-25 DIAGNOSIS — Z1231 Encounter for screening mammogram for malignant neoplasm of breast: Secondary | ICD-10-CM

## 2024-01-09 ENCOUNTER — Other Ambulatory Visit: Payer: Self-pay | Admitting: Family Medicine

## 2024-01-16 ENCOUNTER — Inpatient Hospital Stay: Admission: RE | Admit: 2024-01-16 | Discharge: 2024-01-16 | Attending: Family Medicine | Admitting: Family Medicine

## 2024-01-16 DIAGNOSIS — Z1231 Encounter for screening mammogram for malignant neoplasm of breast: Secondary | ICD-10-CM

## 2024-01-22 ENCOUNTER — Encounter: Payer: Self-pay | Admitting: Family Medicine

## 2024-01-22 ENCOUNTER — Ambulatory Visit: Admitting: Family Medicine

## 2024-01-22 VITALS — BP 126/68 | HR 76 | Temp 98.0°F | Ht 65.0 in | Wt 238.0 lb

## 2024-01-22 DIAGNOSIS — E785 Hyperlipidemia, unspecified: Secondary | ICD-10-CM | POA: Diagnosis not present

## 2024-01-22 DIAGNOSIS — E1169 Type 2 diabetes mellitus with other specified complication: Secondary | ICD-10-CM

## 2024-01-22 DIAGNOSIS — Z7984 Long term (current) use of oral hypoglycemic drugs: Secondary | ICD-10-CM

## 2024-01-22 DIAGNOSIS — E119 Type 2 diabetes mellitus without complications: Secondary | ICD-10-CM

## 2024-01-22 LAB — CBC WITH DIFFERENTIAL/PLATELET
Basophils Absolute: 0 K/uL (ref 0.0–0.1)
Basophils Relative: 0.5 % (ref 0.0–3.0)
Eosinophils Absolute: 0.2 K/uL (ref 0.0–0.7)
Eosinophils Relative: 2.2 % (ref 0.0–5.0)
HCT: 36.3 % (ref 36.0–46.0)
Hemoglobin: 12.1 g/dL (ref 12.0–15.0)
Lymphocytes Relative: 32.7 % (ref 12.0–46.0)
Lymphs Abs: 2.8 K/uL (ref 0.7–4.0)
MCHC: 33.4 g/dL (ref 30.0–36.0)
MCV: 79.5 fl (ref 78.0–100.0)
Monocytes Absolute: 0.4 K/uL (ref 0.1–1.0)
Monocytes Relative: 4.9 % (ref 3.0–12.0)
Neutro Abs: 5.1 K/uL (ref 1.4–7.7)
Neutrophils Relative %: 59.7 % (ref 43.0–77.0)
Platelets: 307 K/uL (ref 150.0–400.0)
RBC: 4.56 Mil/uL (ref 3.87–5.11)
RDW: 14.6 % (ref 11.5–15.5)
WBC: 8.5 K/uL (ref 4.0–10.5)

## 2024-01-22 LAB — HEPATIC FUNCTION PANEL
ALT: 9 U/L (ref 0–35)
AST: 12 U/L (ref 0–37)
Albumin: 4 g/dL (ref 3.5–5.2)
Alkaline Phosphatase: 47 U/L (ref 39–117)
Bilirubin, Direct: 0.1 mg/dL (ref 0.0–0.3)
Total Bilirubin: 1 mg/dL (ref 0.2–1.2)
Total Protein: 7.3 g/dL (ref 6.0–8.3)

## 2024-01-22 LAB — TSH: TSH: 1.35 u[IU]/mL (ref 0.35–5.50)

## 2024-01-22 LAB — BASIC METABOLIC PANEL WITH GFR
BUN: 19 mg/dL (ref 6–23)
CO2: 28 meq/L (ref 19–32)
Calcium: 9.4 mg/dL (ref 8.4–10.5)
Chloride: 102 meq/L (ref 96–112)
Creatinine, Ser: 0.82 mg/dL (ref 0.40–1.20)
GFR: 83.96 mL/min (ref >60.00)
Glucose, Bld: 217 mg/dL — ABNORMAL HIGH (ref 70–99)
Potassium: 4.1 meq/L (ref 3.5–5.1)
Sodium: 136 meq/L (ref 135–145)

## 2024-01-22 LAB — LIPID PANEL
Cholesterol: 162 mg/dL (ref 0–200)
HDL: 49.5 mg/dL (ref >39.00)
LDL Cholesterol: 80 mg/dL (ref 0–99)
NonHDL: 112.2
Total CHOL/HDL Ratio: 3
Triglycerides: 159 mg/dL — ABNORMAL HIGH (ref 0.0–149.0)
VLDL: 31.8 mg/dL (ref 0.0–40.0)

## 2024-01-22 LAB — HEMOGLOBIN A1C: Hgb A1c MFr Bld: 6.5 % (ref 4.6–6.5)

## 2024-01-22 MED ORDER — TIRZEPATIDE 5 MG/0.5ML ~~LOC~~ SOAJ: 5.0000 mg | SUBCUTANEOUS | 1 refills | Status: AC

## 2024-01-22 NOTE — Assessment & Plan Note (Signed)
 Chronic problem.  She is supposed to be on Crestor  but not currently taking.  Check labs and restart meds prn.

## 2024-01-22 NOTE — Patient Instructions (Signed)
 Follow up in 3-4 months to recheck sugar We'll notify you of your lab results and make any changes if needed Continue to work on healthy diet and regular exercise- you're doing great! INCREASE the Mounjaro  to 5mg  weekly STOP the Jardiance  Call with any questions or concerns Stay Safe!  Stay Healthy! Merry Christmas!!!

## 2024-01-22 NOTE — Progress Notes (Signed)
   Subjective:    Patient ID: Belinda Reilly, female    DOB: 07-04-1975, 48 y.o.   MRN: [id number]  HPI DM- chronic problem.  On Jardiance  10mg  daily, Metformin  1000mg  BID, Mounjaro  2.5mg  weekly.  Continues to have yeast issues.  Has only been taking the Metformin  daily rather than twice daily.  UTD on eye exam, foot exam, microalbumin.  Denies CP, SOB, HA's, visual changes.  Denies symptomatic lows.  No numbness/tingling of hands/feet.  Hyperlipidemia- chronic problem.  She is supposed to be on Crestor  10mg  daily but she is not taking.  No abd pain, N/V.  Obesity- ongoing issue.  She is down 8 lbs since Sept.  Pt is tolerating Mounjaro  w/o difficulty.   Review of Systems For ROS see HPI     Objective:   Physical Exam Vitals reviewed.  Constitutional:      General: She is not in acute distress.    Appearance: Normal appearance. She is well-developed. She is obese. She is not ill-appearing.  HENT:     Head: Normocephalic and atraumatic.  Eyes:     Conjunctiva/sclera: Conjunctivae normal.     Pupils: Pupils are equal, round, and reactive to light.  Neck:     Thyroid : No thyromegaly.  Cardiovascular:     Rate and Rhythm: Normal rate and regular rhythm.     Pulses: Normal pulses.     Heart sounds: Normal heart sounds. No murmur heard. Pulmonary:     Effort: Pulmonary effort is normal. No respiratory distress.     Breath sounds: Normal breath sounds.  Abdominal:     General: There is no distension.     Palpations: Abdomen is soft.     Tenderness: There is no abdominal tenderness.  Musculoskeletal:     Cervical back: Normal range of motion and neck supple.     Right lower leg: No edema.     Left lower leg: No edema.  Lymphadenopathy:     Cervical: No cervical adenopathy.  Skin:    General: Skin is warm and dry.  Neurological:     General: No focal deficit present.     Mental Status: She is alert and oriented to person, place, and time.  Psychiatric:        Mood and Affect:  Mood normal.        Behavior: Behavior normal.        Thought Content: Thought content normal.           Assessment & Plan:

## 2024-01-22 NOTE — Assessment & Plan Note (Signed)
 Ongoing issue.  She is down 8 lbs since Sept.  BMI is 39.61 which still qualifies as severe obesity/morbid obesity due to DM and hyperlipidemia.  Will continue to follow.

## 2024-01-22 NOTE — Assessment & Plan Note (Signed)
 Chronic problem.  On Jardiance , Metformin , and Mounjaro .  Unfortunately she continues to have recurrent vaginitis on Jardiance .  Will stop medication and increase Mounjaro  dose.  UTD on eye exam, foot exam, microalbumin.  Currently asymptomatic.  Check labs.

## 2024-01-23 ENCOUNTER — Ambulatory Visit: Payer: Self-pay | Admitting: Family Medicine

## 2024-02-11 ENCOUNTER — Other Ambulatory Visit: Payer: Self-pay | Admitting: Family Medicine

## 2024-03-06 LAB — OPHTHALMOLOGY REPORT-SCANNED

## 2024-04-21 ENCOUNTER — Ambulatory Visit: Admitting: Family Medicine
